# Patient Record
Sex: Male | Born: 1940 | Race: White | Hispanic: No | Smoking: Former smoker
Health system: Southern US, Community
[De-identification: ages and names within clinical notes are randomized; demographics above are authoritative.]

## PROBLEM LIST (undated history)

## (undated) DIAGNOSIS — G629 Polyneuropathy, unspecified: Secondary | ICD-10-CM

## (undated) DIAGNOSIS — T8859XA Other complications of anesthesia, initial encounter: Secondary | ICD-10-CM

## (undated) DIAGNOSIS — I639 Cerebral infarction, unspecified: Secondary | ICD-10-CM

## (undated) HISTORY — PX: CHOLECYSTECTOMY: SHX55

## (undated) HISTORY — PX: PROSTATE SURGERY: SHX751

## (undated) HISTORY — PX: HERNIA REPAIR: SHX51

## (undated) HISTORY — PX: OTHER SURGICAL HISTORY: SHX169

---

## 2012-11-22 ENCOUNTER — Emergency Department: Payer: Self-pay | Admitting: Emergency Medicine

## 2012-11-22 LAB — URINALYSIS, COMPLETE
Glucose,UR: NEGATIVE mg/dL (ref 0–75)
Protein: 100
Specific Gravity: 1.023 (ref 1.003–1.030)
WBC UR: 12 /HPF (ref 0–5)

## 2012-11-24 LAB — URINE CULTURE

## 2020-05-03 ENCOUNTER — Other Ambulatory Visit: Payer: Self-pay | Admitting: Ophthalmology

## 2020-05-03 ENCOUNTER — Other Ambulatory Visit (HOSPITAL_COMMUNITY): Payer: Self-pay | Admitting: Ophthalmology

## 2020-05-03 DIAGNOSIS — G453 Amaurosis fugax: Secondary | ICD-10-CM

## 2020-05-04 ENCOUNTER — Other Ambulatory Visit (HOSPITAL_COMMUNITY): Payer: Self-pay | Admitting: Ophthalmology

## 2020-05-04 DIAGNOSIS — G453 Amaurosis fugax: Secondary | ICD-10-CM

## 2020-05-09 ENCOUNTER — Ambulatory Visit (HOSPITAL_COMMUNITY)
Admission: RE | Admit: 2020-05-09 | Discharge: 2020-05-09 | Disposition: A | Discharge status: Home / Self Care | Payer: Medicare Other | Source: Ambulatory Visit | Attending: Ophthalmology | Admitting: Ophthalmology

## 2020-05-09 ENCOUNTER — Other Ambulatory Visit: Payer: Self-pay

## 2020-05-09 DIAGNOSIS — G453 Amaurosis fugax: Secondary | ICD-10-CM | POA: Insufficient documentation

## 2020-05-09 NOTE — Progress Notes (Signed)
Carotid artery duplex completed. Refer to "CV Proc" under chart review to view preliminary results.  05/09/2020 12:12 PM Eula Fried., MHA, RVT, RDCS, RDMS

## 2020-05-10 ENCOUNTER — Ambulatory Visit (HOSPITAL_COMMUNITY): Payer: Self-pay

## 2020-06-02 ENCOUNTER — Other Ambulatory Visit
Admission: RE | Admit: 2020-06-02 | Discharge: 2020-06-02 | Disposition: A | Discharge status: Home / Self Care | Payer: Medicare Other | Source: Ambulatory Visit | Attending: Ophthalmology | Admitting: Ophthalmology

## 2020-06-02 DIAGNOSIS — G4452 New daily persistent headache (NDPH): Secondary | ICD-10-CM | POA: Insufficient documentation

## 2020-06-02 LAB — CBC WITH DIFFERENTIAL/PLATELET
Abs Immature Granulocytes: 0.04 10*3/uL (ref 0.00–0.07)
Basophils Absolute: 0 10*3/uL (ref 0.0–0.1)
Basophils Relative: 0 %
Eosinophils Absolute: 0.3 10*3/uL (ref 0.0–0.5)
Eosinophils Relative: 4 %
HCT: 41.9 % (ref 39.0–52.0)
Hemoglobin: 14.8 g/dL (ref 13.0–17.0)
Immature Granulocytes: 1 %
Lymphocytes Relative: 30 %
Lymphs Abs: 2.3 10*3/uL (ref 0.7–4.0)
MCH: 29.8 pg (ref 26.0–34.0)
MCHC: 35.3 g/dL (ref 30.0–36.0)
MCV: 84.5 fL (ref 80.0–100.0)
Monocytes Absolute: 0.4 10*3/uL (ref 0.1–1.0)
Monocytes Relative: 6 %
Neutro Abs: 4.6 10*3/uL (ref 1.7–7.7)
Neutrophils Relative %: 59 %
Platelets: 220 10*3/uL (ref 150–400)
RBC: 4.96 MIL/uL (ref 4.22–5.81)
RDW: 12.9 % (ref 11.5–15.5)
WBC: 7.7 10*3/uL (ref 4.0–10.5)
nRBC: 0 % (ref 0.0–0.2)

## 2020-06-02 LAB — C-REACTIVE PROTEIN: CRP: 0.5 mg/dL (ref <1.0)

## 2020-06-02 LAB — SEDIMENTATION RATE: Sed Rate: 4 mm/hr (ref 0–20)

## 2020-06-06 ENCOUNTER — Emergency Department
Admission: EM | Admit: 2020-06-06 | Discharge: 2020-06-06 | Disposition: A | Discharge status: Home / Self Care | Payer: Medicare Other | Attending: Emergency Medicine | Admitting: Emergency Medicine

## 2020-06-06 ENCOUNTER — Other Ambulatory Visit: Payer: Self-pay

## 2020-06-06 ENCOUNTER — Emergency Department: Payer: Medicare Other

## 2020-06-06 DIAGNOSIS — E1142 Type 2 diabetes mellitus with diabetic polyneuropathy: Secondary | ICD-10-CM

## 2020-06-06 DIAGNOSIS — I10 Essential (primary) hypertension: Secondary | ICD-10-CM | POA: Diagnosis not present

## 2020-06-06 DIAGNOSIS — R42 Dizziness and giddiness: Secondary | ICD-10-CM | POA: Insufficient documentation

## 2020-06-06 DIAGNOSIS — Z20822 Contact with and (suspected) exposure to covid-19: Secondary | ICD-10-CM | POA: Diagnosis not present

## 2020-06-06 DIAGNOSIS — E119 Type 2 diabetes mellitus without complications: Secondary | ICD-10-CM | POA: Insufficient documentation

## 2020-06-06 HISTORY — DX: Cerebral infarction, unspecified: I63.9

## 2020-06-06 LAB — URINALYSIS, COMPLETE (UACMP) WITH MICROSCOPIC
Bacteria, UA: NONE SEEN
Bilirubin Urine: NEGATIVE
Glucose, UA: >=500 mg/dL — AB
Ketones, ur: NEGATIVE mg/dL
Leukocytes,Ua: NEGATIVE
Nitrite: NEGATIVE
Protein, ur: 30 mg/dL — AB
Specific Gravity, Urine: 1.024 (ref 1.005–1.030)
Squamous Epithelial / HPF: NONE SEEN (ref 0–5)
pH: 6 (ref 5.0–8.0)

## 2020-06-06 LAB — RESP PANEL BY RT-PCR (FLU A&B, COVID) ARPGX2
Influenza A by PCR: NEGATIVE
Influenza B by PCR: NEGATIVE
SARS Coronavirus 2 by RT PCR: NEGATIVE

## 2020-06-06 LAB — COMPREHENSIVE METABOLIC PANEL
ALT: 20 U/L (ref 0–44)
AST: 23 U/L (ref 15–41)
Albumin: 4.2 g/dL (ref 3.5–5.0)
Alkaline Phosphatase: 55 U/L (ref 38–126)
Anion gap: 9 (ref 5–15)
BUN: 27 mg/dL — ABNORMAL HIGH (ref 8–23)
CO2: 23 mmol/L (ref 22–32)
Calcium: 9.5 mg/dL (ref 8.9–10.3)
Chloride: 104 mmol/L (ref 98–111)
Creatinine, Ser: 1.05 mg/dL (ref 0.61–1.24)
GFR, Estimated: >60 mL/min (ref >60)
Glucose, Bld: 273 mg/dL — ABNORMAL HIGH (ref 70–99)
Potassium: 4 mmol/L (ref 3.5–5.1)
Sodium: 136 mmol/L (ref 135–145)
Total Bilirubin: 0.7 mg/dL (ref 0.3–1.2)
Total Protein: 7.5 g/dL (ref 6.5–8.1)

## 2020-06-06 LAB — DIFFERENTIAL
Abs Immature Granulocytes: 0.02 10*3/uL (ref 0.00–0.07)
Basophils Absolute: 0 10*3/uL (ref 0.0–0.1)
Basophils Relative: 0 %
Eosinophils Absolute: 0.1 10*3/uL (ref 0.0–0.5)
Eosinophils Relative: 2 %
Immature Granulocytes: 0 %
Lymphocytes Relative: 23 %
Lymphs Abs: 1.8 10*3/uL (ref 0.7–4.0)
Monocytes Absolute: 0.5 10*3/uL (ref 0.1–1.0)
Monocytes Relative: 6 %
Neutro Abs: 5.6 10*3/uL (ref 1.7–7.7)
Neutrophils Relative %: 69 %

## 2020-06-06 LAB — CBC
HCT: 42.1 % (ref 39.0–52.0)
Hemoglobin: 14.4 g/dL (ref 13.0–17.0)
MCH: 29.1 pg (ref 26.0–34.0)
MCHC: 34.2 g/dL (ref 30.0–36.0)
MCV: 85.1 fL (ref 80.0–100.0)
Platelets: 226 10*3/uL (ref 150–400)
RBC: 4.95 MIL/uL (ref 4.22–5.81)
RDW: 12.8 % (ref 11.5–15.5)
WBC: 8 10*3/uL (ref 4.0–10.5)
nRBC: 0 % (ref 0.0–0.2)

## 2020-06-06 LAB — PROTIME-INR
INR: 1 (ref 0.8–1.2)
Prothrombin Time: 12.9 seconds (ref 11.4–15.2)

## 2020-06-06 LAB — APTT: aPTT: 26 seconds (ref 24–36)

## 2020-06-06 MED ORDER — MECLIZINE HCL 25 MG PO TABS
25.0000 mg | ORAL_TABLET | Freq: Once | ORAL | Status: AC
  Administered 2020-06-06: 25 mg via ORAL
  Filled 2020-06-06: qty 1

## 2020-06-06 MED ORDER — SODIUM CHLORIDE 0.9% FLUSH: 3.0000 mL | Freq: Once | INTRAVENOUS | Status: DC

## 2020-06-06 NOTE — ED Notes (Signed)
Pt declined dc vitals.

## 2020-06-06 NOTE — ED Provider Notes (Signed)
[facility] Emergency Department Provider Note  ____________________________________________  Time seen: Approximately 2:34 PM  I have reviewed the triage vital signs and the nursing notes.   HISTORY  Chief Complaint Weakness    Level 5 Caveat: Portions of the History and Physical including HPI and review of systems are unable to be completely obtained due to patient being a poor historian   HPI Garrett Henderson is a 80 y.o. male with a past history of stroke, hypertension, hyperlipidemia, diabetes who comes to the ED complaining of dizziness and feeling off balance since about 3:00 AM today.  Has some intermittent mild headache as well, no vision changes or focal weakness or paresthesia.  He has chronic peripheral neuropathy which is unchanged.  No change in speech.  Recently had a stroke characterized by what patient describes temporary monocular vision loss, occurred about a month ago, maintained on aspirin currently.  He reports being seen by ophthalmology who told him that his eye was fine.  Vision feels normal now.  No falls or trauma, no fevers chills chest pain shortness of breath or palpitations.  No neck stiffness or fever.      Past Medical History:  Diagnosis Date  . Stroke [facility])      There are no problems to display for this patient.    Past Surgical History:  Procedure Laterality Date  . PROSTATE SURGERY       Prior to Admission medications   Not on File     Allergies Patient has no allergy information on record.   No family history on file.  Social History    Review of Systems Level 5 Caveat: Portions of the History and Physical including HPI and review of systems are unable to be completely obtained due to patient being a poor historian   Constitutional:   No known fever.  ENT:   No rhinorrhea. Cardiovascular:   No chest pain or syncope. Respiratory:   No dyspnea or cough. Gastrointestinal:   Negative for  abdominal pain, vomiting and diarrhea.  Musculoskeletal:   Negative for focal pain or swelling ____________________________________________   PHYSICAL EXAM:  VITAL SIGNS: ED Triage Vitals  Enc Vitals Group     BP 06/06/20 1307 129/74     Pulse Rate 06/06/20 1307 88     Resp 06/06/20 1307 19     Temp 06/06/20 1307 98 F (36.7 C)     Temp src --      SpO2 06/06/20 1307 96 %     Weight --      Height --      Head Circumference --      Peak Flow --      Pain Score 06/06/20 1305 0     Pain Loc --      Pain Edu? --      Excl. in GC? --     Vital signs reviewed, nursing assessments reviewed.   Constitutional:   Alert and oriented. Non-toxic appearance. Eyes:   Conjunctivae are normal. EOMI. PERRL.  No nystagmus. ENT      Head:   Normocephalic and atraumatic.      Nose:   No congestion/rhinnorhea.       Mouth/Throat:   MMM, no pharyngeal erythema. No peritonsillar mass.       Neck:   No meningismus. Full ROM. Hematological/Lymphatic/Immunilogical:   No cervical lymphadenopathy. Cardiovascular:   RRR. Symmetric bilateral radial and DP pulses.  No murmurs. Cap refill less than 2 seconds. Respiratory:  Normal respiratory effort without tachypnea/retractions. Breath sounds are clear and equal bilaterally. No wheezes/rales/rhonchi. Gastrointestinal:   Soft and nontender. Non distended. There is no CVA tenderness.  No rebound, rigidity, or guarding. Genitourinary:   deferred Musculoskeletal:   Normal range of motion in all extremities. No joint effusions.  No lower extremity tenderness.  No edema. Neurologic:   Normal speech and language.  Poor short-term memory recall Cranial nerves II through XII intact Motor grossly intact.  No drift Normal finger-to-nose.  No pronator drift. No acute focal neurologic deficits are appreciated.  NIH stroke scale 0 Skin:    Skin is warm, dry and intact. No rash noted.  No petechiae, purpura, or  bullae.  ____________________________________________    LABS (pertinent positives/negatives) (all labs ordered are listed, but only abnormal results are displayed) Labs Reviewed  COMPREHENSIVE METABOLIC PANEL - Abnormal; Notable for the following components:      Result Value   Glucose, Bld 273 (*)    BUN 27 (*)    All other components within normal limits  RESP PANEL BY RT-PCR (FLU A&B, COVID) ARPGX2  PROTIME-INR  APTT  CBC  DIFFERENTIAL  URINALYSIS, COMPLETE (UACMP) WITH MICROSCOPIC  I-STAT CREATININE, ED  CBG MONITORING, ED   ____________________________________________   EKG  Interpreted by me Sinus rhythm rate of 89, normal axis, first-degree AV block.  Poor R wave progression.  Normal ST segments and T waves.  No ischemic changes.  ____________________________________________    RADIOLOGY  CT HEAD WO CONTRAST  Result Date: 06/06/2020 CLINICAL DATA:  Neuro deficit, acute stroke suspected. EXAM: CT HEAD WITHOUT CONTRAST TECHNIQUE: Contiguous axial images were obtained from the base of the skull through the vertex without intravenous contrast. COMPARISON:  None. FINDINGS: Brain: No evidence of acute large vascular territory infarction, hemorrhage, hydrocephalus, extra-axial collection or mass lesion/mass effect. Mild to moderate atrophy with ex vacuo ventricular dilation. Vascular: No hyperdense vessel. Calcific intracranial atherosclerosis. Skull: No acute fracture. Sinuses/Orbits: Visualized sinuses are clear. Other: No mastoid effusions. IMPRESSION: No evidence of acute intracranial abnormality. Electronically Signed   By: Feliberto Harts MD   On: 06/06/2020 13:36    ____________________________________________   PROCEDURES Procedures  ____________________________________________  DIFFERENTIAL DIAGNOSIS   Dehydration, vertigo, autonomic insufficiency with orthostatic symptoms, stroke  CLINICAL IMPRESSION / ASSESSMENT AND PLAN / ED COURSE  Medications  ordered in the ED: Medications  sodium chloride flush (NS) 0.9 % injection 3 mL (3 mLs Intravenous Not Given 06/06/20 1357)  meclizine (ANTIVERT) tablet 25 mg (25 mg Oral Given 06/06/20 1402)    Pertinent labs & imaging results that were available during my care of the patient were reviewed by me and considered in my medical decision making (see chart for details).   GLENROY CROSSEN was evaluated in Emergency Department on 06/06/2020 for the symptoms described in the history of present illness. He was evaluated in the context of the global COVID-19 pandemic, which necessitated consideration that the patient might be at risk for infection with the SARS-CoV-2 virus that causes COVID-19. Institutional protocols and algorithms that pertain to the evaluation of patients at risk for COVID-19 are in a state of rapid change based on information released by regulatory bodies including the CDC and federal and state organizations. These policies and algorithms were followed during the patient's care in the ED.   Patient presents with vague dizziness symptoms.  On exam he is neurologically intact.  Vital signs unremarkable.  Labs normal.  With his age and comorbidities, will obtain MRI/MRA head  today to evaluate for possible cerebellar/thalamic stroke.  If negative, he can be discharged home to continue his current outpatient management and follow-up with primary care this week.  Clinical Course as of 06/06/20 1532  Tue Jun 06, 2020  1532 Ambulates with cane, steady gait.  [PS]    Clinical Course User Index [PS] Sharman Cheek, MD     ____________________________________________   FINAL CLINICAL IMPRESSION(S) / ED DIAGNOSES    Final diagnoses:  Dizziness     ED Discharge Orders    None      Portions of this note were generated with dragon dictation software. Dictation errors may occur despite best attempts at proofreading.   Sharman Cheek, MD 06/06/20 1438

## 2020-06-06 NOTE — ED Notes (Signed)
Pt ambulatory with cane. 

## 2020-06-06 NOTE — ED Triage Notes (Addendum)
Pt comes wit hc/o headache, weakness and some dizziness. Pt recently had stroke 4 weeks ago.   Pt states he woke up early this morning around 3am and felt like his head was going to fall off. Pt states he felt off balance.

## 2020-06-06 NOTE — ED Notes (Signed)
Pt assisted with urinal

## 2020-06-06 NOTE — ED Triage Notes (Signed)
First Nurse Note:  C/O dizziness, loss of balance since waking up at 0300.  States when he went to bed last night he felt fine.  AAOx3.  Skin warm and dry. MAE equally and strong.  NAD.  Speech clear.  Facial movements equal.  Patient reports recent CVA.

## 2020-07-28 ENCOUNTER — Emergency Department: Payer: Medicare Other

## 2020-07-28 ENCOUNTER — Encounter: Payer: Self-pay | Admitting: Emergency Medicine

## 2020-07-28 ENCOUNTER — Emergency Department
Admission: EM | Admit: 2020-07-28 | Discharge: 2020-07-28 | Disposition: A | Discharge status: Home / Self Care | Payer: Medicare Other | Attending: Emergency Medicine | Admitting: Emergency Medicine

## 2020-07-28 ENCOUNTER — Other Ambulatory Visit: Payer: Self-pay

## 2020-07-28 DIAGNOSIS — S4992XA Unspecified injury of left shoulder and upper arm, initial encounter: Secondary | ICD-10-CM | POA: Diagnosis present

## 2020-07-28 DIAGNOSIS — Z7984 Long term (current) use of oral hypoglycemic drugs: Secondary | ICD-10-CM | POA: Diagnosis not present

## 2020-07-28 DIAGNOSIS — X58XXXA Exposure to other specified factors, initial encounter: Secondary | ICD-10-CM | POA: Diagnosis not present

## 2020-07-28 DIAGNOSIS — Z87891 Personal history of nicotine dependence: Secondary | ICD-10-CM | POA: Diagnosis not present

## 2020-07-28 DIAGNOSIS — S46912A Strain of unspecified muscle, fascia and tendon at shoulder and upper arm level, left arm, initial encounter: Secondary | ICD-10-CM | POA: Insufficient documentation

## 2020-07-28 MED ORDER — IBUPROFEN 600 MG PO TABS: 600.0000 mg | ORAL_TABLET | Freq: Three times a day (TID) | ORAL | 0 refills | Status: AC | PRN

## 2020-07-28 MED ORDER — IBUPROFEN 800 MG PO TABS
800.0000 mg | ORAL_TABLET | Freq: Once | ORAL | Status: AC
  Administered 2020-07-28: 800 mg via ORAL
  Filled 2020-07-28: qty 1

## 2020-07-28 NOTE — ED Triage Notes (Signed)
Says he pulled a muscle on left arm.  Hurts since Tuesday.  Thinks he may have reached when he was in a drivethrough.  Pain increases if he tries to lift his arm.

## 2020-07-28 NOTE — Discharge Instructions (Addendum)
You were seen today for left shoulder pain.  Your x-ray does not show any acute findings.  We recommend rest, ice for 10 minutes twice daily and stretching exercises.  I have given you prescription for ibuprofen to take every 8 hours as needed with food.  Please follow-up with your PCP if symptoms persist or worsen.

## 2020-07-28 NOTE — ED Provider Notes (Signed)
[facility] Emergency Department Provider Note ____________________________________________  Time seen: 0830  I have reviewed the triage vital signs and the nursing notes.  HISTORY  Chief Complaint  Shoulder Pain   HPI Garrett Henderson is a 80 y.o. male presents to the ER today with complaint of left shoulder pain.  He reports this started 2 days ago.  He describes the pain as sore and achy.  The pain does not radiate.  The pain is worse when he tries to lift his left arm.  He denies numbness, tingling or weakness of his left upper extremity.  He denies any injury to the area but reports pain started after reaching for his wallet while he was in the drive-through.  He has taken some leftover pain pills that he does not remember the name of with some relief of symptoms.  Past Medical History:  Diagnosis Date   Stroke [facility])     There are no problems to display for this patient.   Past Surgical History:  Procedure Laterality Date   PROSTATE SURGERY      Prior to Admission medications   Medication Sig Start Date End Date Taking? Authorizing Provider  gabapentin (NEURONTIN) 100 MG capsule Take 100 mg by mouth 3 (three) times daily.   Yes [provider]  glipiZIDE (GLUCOTROL) 5 MG tablet Take by mouth daily before breakfast.   Yes [provider]  ibuprofen (ADVIL) 600 MG tablet Take 1 tablet (600 mg total) by mouth every 8 (eight) hours as needed. 07/28/20  Yes Lorre Munroe, NP  metFORMIN (GLUCOPHAGE) 1000 MG tablet Take 1,000 mg by mouth 2 (two) times daily with a meal.   Yes [provider]  simvastatin (ZOCOR) 10 MG tablet Take 10 mg by mouth daily.   Yes [provider]    Allergies Patient has no known allergies.  No family history on file.  Social History Social History   Tobacco Use   Smoking status: Former    Types: Cigarettes    Review of Systems  Constitutional: Negative for fever, chills or body  aches. Cardiovascular: Negative for chest pain or chest tightness. Respiratory: Negative for cough shortness of breath. Musculoskeletal: Positive for left shoulder pain.  Negative for neck, back, elbow or wrist pain.  Negative for joint swelling. Skin: Negative for rash. Neurological: Negative for focal weakness, tingling or numbness. ____________________________________________  PHYSICAL EXAM:  VITAL SIGNS: ED Triage Vitals  Enc Vitals Group     BP 07/28/20 0821 139/72     Pulse Rate 07/28/20 0821 94     Resp 07/28/20 0821 18     Temp 07/28/20 0821 98.2 F (36.8 C)     Temp Source 07/28/20 0821 Oral     SpO2 07/28/20 0821 97 %     Weight 07/28/20 0822 180 lb (81.6 kg)     Height 07/28/20 0822 5\' 8"  (1.727 m)     Head Circumference --      Peak Flow --      Pain Score 07/28/20 0816 10     Pain Loc --      Pain Edu? --      Excl. in GC? --     Constitutional: Alert and oriented.  Appears in pain but in no distress. Head: Normocephalic. Cardiovascular: Normal rate, regular rhythm.  Radial pulses 2+ bilaterally. Respiratory: Normal respiratory effort. No wheezes/rales/rhonchi. Musculoskeletal: Decreased external rotation of the left shoulder secondary to pain.  Normal internal rotation of the left  shoulder.  No pain with palpation of the left shoulder or upper arm.  Negative drop can test on the left.  Strength 5/5 BUE.  Handgrips equal. Neurologic: Resting tremor noted of the right upper extremity.  Normal speech and language. Skin:  Skin is warm, dry and intact. No rash noted. ____________________________________________   RADIOLOGY  Imaging Orders  DG Shoulder Left  IMPRESSION:  1. No acute finding.  2. Glenohumeral and inferior acromion spurring.  ____________________________________________   INITIAL IMPRESSION / ASSESSMENT AND PLAN / ED COURSE  Acute Left Shoulder Pain:  DDx include osteoarthritis, left shoulder strain, biceps strain Ibuprofen 800 mg PO  x  1 Xray left shoulder does not show any acute findings RX for Ibuprofen 600 mg Q8H prn- with food Shoulder exercises given Ice may be helpful Follow up with your PCP if symptoms persist      I reviewed the patient's prescription history over the last 12 months in the multi-state controlled substances database(s) that includes [place], [place], [place], [place], [place], [place], [place], [place], [place], [place], [place], [place], [place], and [place].  Results were notable for no recent controlled substances ____________________________________________  FINAL CLINICAL IMPRESSION(S) / ED DIAGNOSES  Final diagnoses:  Strain of left shoulder, initial encounter      Lorre Munroe, NP 07/28/20 0930    Sharman Cheek, MD 07/28/20 1540

## 2021-04-10 ENCOUNTER — Other Ambulatory Visit: Payer: Self-pay

## 2021-04-10 ENCOUNTER — Emergency Department
Admission: EM | Admit: 2021-04-10 | Discharge: 2021-04-10 | Disposition: A | Discharge status: Home / Self Care | Payer: Medicare HMO | Attending: Emergency Medicine | Admitting: Emergency Medicine

## 2021-04-10 ENCOUNTER — Encounter: Payer: Self-pay | Admitting: Emergency Medicine

## 2021-04-10 ENCOUNTER — Emergency Department: Payer: Medicare HMO

## 2021-04-10 DIAGNOSIS — R7309 Other abnormal glucose: Secondary | ICD-10-CM | POA: Insufficient documentation

## 2021-04-10 DIAGNOSIS — D649 Anemia, unspecified: Secondary | ICD-10-CM | POA: Insufficient documentation

## 2021-04-10 DIAGNOSIS — R319 Hematuria, unspecified: Secondary | ICD-10-CM | POA: Insufficient documentation

## 2021-04-10 LAB — URINALYSIS, ROUTINE W REFLEX MICROSCOPIC
Bacteria, UA: NONE SEEN
Bilirubin Urine: NEGATIVE
Glucose, UA: 150 mg/dL — AB
Ketones, ur: NEGATIVE mg/dL
Leukocytes,Ua: NEGATIVE
Nitrite: NEGATIVE
Protein, ur: 30 mg/dL — AB
RBC / HPF: >50 RBC/hpf — ABNORMAL HIGH (ref 0–5)
Specific Gravity, Urine: 1.019 (ref 1.005–1.030)
pH: 5 (ref 5.0–8.0)

## 2021-04-10 LAB — CBC WITH DIFFERENTIAL/PLATELET
Abs Immature Granulocytes: 0.02 10*3/uL (ref 0.00–0.07)
Basophils Absolute: 0 10*3/uL (ref 0.0–0.1)
Basophils Relative: 0 %
Eosinophils Absolute: 0.1 10*3/uL (ref 0.0–0.5)
Eosinophils Relative: 2 %
HCT: 45.5 % (ref 39.0–52.0)
Hemoglobin: 15.3 g/dL (ref 13.0–17.0)
Immature Granulocytes: 0 %
Lymphocytes Relative: 23 %
Lymphs Abs: 1.7 10*3/uL (ref 0.7–4.0)
MCH: 28.6 pg (ref 26.0–34.0)
MCHC: 33.6 g/dL (ref 30.0–36.0)
MCV: 85 fL (ref 80.0–100.0)
Monocytes Absolute: 0.4 10*3/uL (ref 0.1–1.0)
Monocytes Relative: 6 %
Neutro Abs: 5.1 10*3/uL (ref 1.7–7.7)
Neutrophils Relative %: 69 %
Platelets: 210 10*3/uL (ref 150–400)
RBC: 5.35 MIL/uL (ref 4.22–5.81)
RDW: 12.2 % (ref 11.5–15.5)
WBC: 7.3 10*3/uL (ref 4.0–10.5)
nRBC: 0 % (ref 0.0–0.2)

## 2021-04-10 LAB — CBG MONITORING, ED: Glucose-Capillary: 169 mg/dL — ABNORMAL HIGH (ref 70–99)

## 2021-04-10 MED ORDER — SULFAMETHOXAZOLE-TRIMETHOPRIM 800-160 MG PO TABS: 1.0000 | ORAL_TABLET | Freq: Two times a day (BID) | ORAL | 0 refills | Status: AC

## 2021-04-10 NOTE — ED Notes (Signed)
Patient transported to CT 

## 2021-04-10 NOTE — ED Provider Notes (Signed)
? ?[facility] ?Provider Note ? ? ? Event Date/Time  ? First MD Initiated Contact with Patient 04/10/21 1317   ?  (approximate) ? ? ?History  ? ?Hematuria ? ? ?HPI ? ?DIEM PAGNOTTA is a 81 y.o. male who reports he started having hematuria in the middle of the night last night.  He is also passing some clots.  He reports he had kidney stones and [place] an artery and he almost bled out.  He was seen at [facility] and has been here since then.  Unfortunately I cannot get care everywhere to access any records on him at [facility], the computer system there says there are not any.  Additionally I cannot find anything about any visits here for kidney stones although he has many other visits here. ? ?  ? ? ?Physical Exam  ? ?Triage Vital Signs: ?ED Triage Vitals  ?Enc Vitals Group  ?   BP 04/10/21 1023 (!) 157/77  ?   Pulse Rate 04/10/21 1023 98  ?   Resp 04/10/21 1023 16  ?   Temp 04/10/21 1023 98.3 ?F (36.8 ?C)  ?   Temp Source 04/10/21 1023 Oral  ?   SpO2 04/10/21 1023 97 %  ?   Weight 04/10/21 1021 178 lb (80.7 kg)  ?   Height 04/10/21 1021 5\' 8"  (1.727 m)  ?   Head Circumference --   ?   Peak Flow --   ?   Pain Score 04/10/21 1021 0  ?   Pain Loc --   ?   Pain Edu? --   ?   Excl. in GC? --   ? ? ?Most recent vital signs: ?Vitals:  ? 04/10/21 1330 04/10/21 1500  ?BP: (!) 153/74   ?Pulse: 68 73  ?Resp: 18   ?Temp:    ?SpO2: 100% 100%  ? ? ? ?General: Awake, no distress.  ?CV:  Good peripheral perfusion.  Heart regular rate and rhythm no audible murmur ?Resp:  Normal effort.  Lungs are clear ?Abd:  No distention.  Soft and nontender ?Extremities: No edema ? ? ?ED Results / Procedures / Treatments  ? ?Labs ?(all labs ordered are listed, but only abnormal results are displayed) ?Labs Reviewed  ?URINALYSIS, ROUTINE W REFLEX MICROSCOPIC - Abnormal; Notable for the following components:  ?    Result Value  ? Color, Urine YELLOW (*)   ?  APPearance HAZY (*)   ? Glucose, UA 150 (*)   ? Hgb urine dipstick LARGE (*)   ? Protein, ur 30 (*)   ? RBC / HPF >50 (*)   ? All other components within normal limits  ?CBG MONITORING, ED - Abnormal; Notable for the following components:  ? Glucose-Capillary 169 (*)   ? All other components within normal limits  ?CBC WITH DIFFERENTIAL/PLATELET  ? ? ? ?EKG ? ? ? ? ?RADIOLOGY ?CT by my reading shows a very small anterior renal stone and some left-sided hydronephrosis.  Radiology only comments on the very small nonobstructing renal stone. ? ? ?PROCEDURES: ? ?Critical Care performed:  ? ?Procedures ? ? ?MEDICATIONS ORDERED IN ED: ?Medications - No data to display ? ? ?IMPRESSION / MDM / ASSESSMENT AND PLAN / ED COURSE  ?I reviewed the triage vital signs and the nursing notes. ? ?Discussed the patient in detail with Dr. 06/10/21 including my interpretation of the CT as a noted above Dr. Lonna Cobb  will see the patient in the office, patient will come back here if he has any problems urinating for instance if he clots obstructives outflow.  Right now he is not having any problems like that.  He has a high H&H which should keep him from becoming anemic.  I will have him return here if he has any increasing bleeding or cannot urinate or get lightheaded or has any other problems.  Dr. Lonna Cobb wants him to take Bactrim in the meantime ?Stone that the patient does have would not cause any problems.  Not sure why he is having hematuria.  He may have something going on with his prostate again.  He likely will need cystoscope. ? ?Clinical Course as of 04/10/21 1628  ?Tue Apr 10, 2021  ?1353 CBG monitoring, ED(!) [PM]  ?  ?Clinical Course User Index ?[PM] Arnaldo Natal, MD  ? ? ? ?FINAL CLINICAL IMPRESSION(S) / ED DIAGNOSES  ? ?Final diagnoses:  ?Hematuria, unspecified type  ? ? ? ?Rx / DC Orders  ? ?ED Discharge Orders   ? ?      Ordered  ?  sulfamethoxazole-trimethoprim (BACTRIM DS) 800-160 MG tablet  2 times daily       ?  04/10/21 1501  ? ?  ?  ? ?  ? ? ? ?Note:  This document was prepared using Dragon voice recognition software and may include unintentional dictation errors. ?  ?Arnaldo Natal, MD ?04/10/21 1630 ? ?

## 2021-04-10 NOTE — ED Triage Notes (Signed)
Pt to ED via POV c/o blood in his urine that started in the middle of the night. Pt reports that he is passing some clots as well. Pt reports increased urination yesterday. 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 Pt denies any more back pain than his normal pain. Pt is in NAD.  ?

## 2021-04-10 NOTE — Discharge Instructions (Addendum)
Please follow-up with [facility].  I have spoken with Dr. Lonna Cobb but he is in the operating room and cannot make an appointment for you right now.  I would call the office at the number I have supplied let them know you were in the emergency room with blood in the urine and the Dr. Lonna Cobb wanted to follow you up in the office.  Please do not hesitate to return if you get a fever or lightheaded or become obstructed and cannot urinate or have worse pain.  Dr. Lonna Cobb wants you to take Septra a sulfa antibiotic 1 pill twice a day to make sure you are not getting an infection. ?

## 2021-04-12 ENCOUNTER — Encounter: Payer: Self-pay | Admitting: Urology

## 2021-04-12 ENCOUNTER — Ambulatory Visit: Payer: Medicare HMO | Admitting: Urology

## 2021-04-12 VITALS — BP 136/77 | HR 73 | Ht 68.0 in | Wt 177.0 lb

## 2021-04-12 DIAGNOSIS — R310 Gross hematuria: Secondary | ICD-10-CM | POA: Diagnosis not present

## 2021-04-12 LAB — URINALYSIS, COMPLETE
Bilirubin, UA: NEGATIVE
Ketones, UA: NEGATIVE
Leukocytes,UA: NEGATIVE
Nitrite, UA: NEGATIVE
Protein,UA: NEGATIVE
Specific Gravity, UA: >1.03 — ABNORMAL HIGH (ref 1.005–1.030)
Urobilinogen, Ur: 0.2 mg/dL (ref 0.2–1.0)
pH, UA: 6 (ref 5.0–7.5)

## 2021-04-12 LAB — MICROSCOPIC EXAMINATION
Bacteria, UA: NONE SEEN
RBC, Urine: NONE SEEN /hpf (ref 0–2)

## 2021-04-12 NOTE — Progress Notes (Signed)
? ?04/12/2021 ?9:38 AM  ? ?[name] ?04-12-1940 ?[id number] ? ?Referring provider: Lenard Simmer, MD ?[place] ?[place],  [place] [address] ? ?Chief Complaint  ?Patient presents with  ? Hematuria  ? ? ?HPI: ?Garrett Henderson is a 81 y.o. male presents for evaluation of gross hematuria.  He is accompanied by his daughter. ? ?Onset gross hematuria with clots 04/09/2021 and lasted <24 hours ?Denies flank, abdominal or pelvic pain ?Mild frequency and urgency ?Presented to the ED 04/10/2021 ?UA grossly clear with >50 RBC/6-10 WBC ?Started on Septra DS; urine culture not ordered ?Noncontrast CT was performed which showed punctate left lower pole calculus.  No hydronephrosis or mass noted ?Prior history stone disease and apparently underwent TURP and stone surgery in [place] around 2013.  He states and "artery was nicked" and he was life flighted to [facility] in [place] ? ? ?PMH: ?Past Medical History:  ?Diagnosis Date  ? Stroke [facility])   ? ? ?Surgical History: ?Past Surgical History:  ?Procedure Laterality Date  ? PROSTATE SURGERY    ? ? ?Home Medications:  ?Allergies as of 04/12/2021   ?No Known Allergies ?  ? ?  ?Medication List  ?  ? ?  ? Accurate as of April 12, 2021  9:38 AM. If you have any questions, ask your nurse or doctor.  ?  ?  ? ?  ? ?gabapentin 100 MG capsule ?Commonly known as: NEURONTIN ?Take 100 mg by mouth 3 (three) times daily. ?  ?glipiZIDE 5 MG tablet ?Commonly known as: GLUCOTROL ?Take by mouth daily before breakfast. ?  ?ibuprofen 600 MG tablet ?Commonly known as: ADVIL ?Take 1 tablet (600 mg total) by mouth every 8 (eight) hours as needed. ?  ?metFORMIN 1000 MG tablet ?Commonly known as: GLUCOPHAGE ?Take 1,000 mg by mouth 2 (two) times daily with a meal. ?  ?pioglitazone 15 MG tablet ?Commonly known as: ACTOS ?Take 15 mg by mouth daily. ?  ?simvastatin 10 MG tablet ?Commonly known as: ZOCOR ?Take 10 mg by mouth daily. ?  ?sulfamethoxazole-trimethoprim 800-160 MG tablet ?Commonly  known as: BACTRIM DS ?Take 1 tablet by mouth 2 (two) times daily. ?  ? ?  ? ? ?Allergies: No Known Allergies ? ?Family History: ?History reviewed. No pertinent family history. ? ?Social History:  reports that he has quit smoking. His smoking use included cigarettes. He does not have any smokeless tobacco history on file. No history on file for alcohol use and drug use. ? ? ?Physical Exam: ?BP 136/77   Pulse 73   Ht 5\' 8"  (1.727 m)   Wt 177 lb (80.3 kg)   BMI 26.91 kg/m?   ?Constitutional:  Alert and oriented, No acute distress. ?HEENT: [place] AT, moist mucus membranes.  Trachea midline, no masses. ?Cardiovascular: No clubbing, cyanosis, or edema. ?Respiratory: Normal respiratory effort, no increased work of breathing. ?Skin: No rashes, bruises or suspicious lesions. ?Neurologic: Grossly intact, no focal deficits, moving all 4 extremities. ?Psychiatric: Normal mood and affect. ? ?Laboratory Data: ? ?Urinalysis ?Dipstick 2+ glucose/trace blood ?Microscopy negative ? ? ?Pertinent Imaging: ?CT images were personally reviewed and interpreted ? ?CT Renal Stone Study ? ?Narrative ?CLINICAL DATA:  Hematuria.   prostate surgery. ? ?EXAM: ?CT ABDOMEN AND PELVIS WITHOUT CONTRAST ? ?TECHNIQUE: ?Multidetector CT imaging of the abdomen and pelvis was performed ?following the standard protocol without IV contrast. ? ?RADIATION DOSE REDUCTION: This exam was performed according to the ?departmental dose-optimization program which includes automated ?exposure control, adjustment of the mA  and/or kV according to ?patient size and/or use of iterative reconstruction technique. ? ?COMPARISON:  None. ? ?FINDINGS: ?Lower chest: Clear lung bases. Normal heart size without pericardial ?or pleural effusion. Multivessel coronary artery atherosclerosis. ?Tiny hiatal hernia. ? ?Hepatobiliary: Normal liver. Cholecystectomy, without biliary ductal ?dilatation. ? ?Pancreas: Parenchymal calcifications within the head and neck. No ?duct dilatation or  acute inflammation. ? ?Spleen: Normal in size, without focal abnormality. ? ?Adrenals/Urinary Tract: Normal adrenal glands. Subcentimeter upper ?pole right renal lesion is fluid density and likely a cyst . No f/up ?imaging recommended. Right renal vascular calcification. Punctate ?lower pole left renal collecting system stone. No hydronephrosis. No ?hydroureter or ureteric calculi. No bladder calculi. Tiny ?right-sided bladder diverticulum on 66/2. ? ?Stomach/Bowel: Proximal gastric underdistention. Colonic stool ?burden suggests constipation. Normal terminal ileum and appendix. ?Prior enterotomy. ? ?Vascular/Lymphatic: Aortic atherosclerosis. Small nodes in the ?jejunal mesentery with increased density in the mesenteric fat. No ?pelvic sidewall adenopathy. ? ?Reproductive: Moderate prostatomegaly. ? ?Other: No significant free fluid. No free intraperitoneal air. Small ?fat containing right inguinal hernia. ? ?Musculoskeletal: Degenerate changes of both hips. Flowing ?syndesmophytes throughout the thoracolumbar spine can be seen with ?ankylosing spondylitis. ? ?IMPRESSION: ?1. Left nephrolithiasis, without obstructive uropathy. ?2. Prostatomegaly with small bladder diverticulum, suggesting a ?component of outlet obstruction. ?3. No other explanation for patient's symptoms. ?4. Coronary artery atherosclerosis. Aortic Atherosclerosis ?(ICD10-I70.0). ?5.  Possible constipation. ?6. Small bowel mesenteric findings which suggest ?panniculitis/adenitis. These are of indeterminate acuity and ?clinical significance. ?7. 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 ? ? ?Electronically Signed ?By: Abigail Miyamoto M.D. ?On: 04/10/2021 14:55 ? ? ?Assessment & Plan:   ? ?1. Gross hematuria ?Resolved ?Upper tract imaging recommended for gross hematuria is a CT urogram.  Noncontrast CT without abnormalities ?Recommend lower tract evaluation with cystoscopy and if no abnormalities noted may need to  proceed with CT urogram ? ? ? ?Abbie Sons, MD ? ?[facility] ?[address], [address] ?[place], [place] [address] ?[phone] ? ?

## 2021-04-16 ENCOUNTER — Other Ambulatory Visit: Payer: Self-pay

## 2021-04-19 ENCOUNTER — Telehealth: Payer: Self-pay | Admitting: Urology

## 2021-04-19 NOTE — Telephone Encounter (Signed)
Pt called back and would like to proceed with cystoscopy. Pt would like valium sent to [facility]. ?

## 2021-04-19 NOTE — Telephone Encounter (Signed)
Patient is scheduled for a cysto on Monday, 4/17.  He called the office today requesting a prescription for a valium to take prior to his procedure.  He is very nervous about the procedure.  He stated that when we check his urine, if there is no blood in the result, he may not have the cysto. ? ?Please advise. ?

## 2021-04-20 NOTE — Telephone Encounter (Signed)
Pt called again concerned about the valium not being sent over to the pharmacy yet.pt will cancel if he doesn't have valium. I advised pt that it will be sent. ?

## 2021-04-22 MED ORDER — DIAZEPAM 2 MG PO TABS: ORAL_TABLET | ORAL | 0 refills | Status: DC

## 2021-04-22 NOTE — Telephone Encounter (Signed)
Rx sent 

## 2021-04-23 ENCOUNTER — Encounter: Payer: Self-pay | Admitting: Urology

## 2021-04-23 ENCOUNTER — Other Ambulatory Visit: Payer: Medicare HMO | Admitting: Urology

## 2021-05-01 ENCOUNTER — Telehealth: Payer: Self-pay

## 2021-05-01 ENCOUNTER — Other Ambulatory Visit: Payer: Self-pay

## 2021-05-01 DIAGNOSIS — R195 Other fecal abnormalities: Secondary | ICD-10-CM

## 2021-05-01 MED ORDER — PEG 3350-KCL-NA BICARB-NACL 420 G PO SOLR: 4000.0000 mL | Freq: Once | ORAL | 0 refills | Status: AC

## 2021-05-01 NOTE — Telephone Encounter (Signed)
Gastroenterology Pre-Procedure Review ? ?Request Date: Thursday 05/10/21 ?Requesting Physician: Dr. Tobi Bastos ? ?PATIENT REVIEW QUESTIONS: The patient's daughter Rosaria Ferries responded to the following health history questions as indicated:   ? ?1. Are you having any GI issues? Positive Colorectal ?2. Do you have a personal history of Polyps? no ?3. Do you have a family history of Colon Cancer or Polyps? no ?4. Diabetes Mellitus? yes (Controlled with oral meds) ?5. Joint replacements in the past 12 months?no ?6. Major health problems in the past 3 months? Hematuria ER visit 04/10/21 but resolved. ?7. Any artificial heart valves, MVP, or defibrillator?no ?   ?MEDICATIONS & ALLERGIES:    ?Patient reports the following regarding taking any anticoagulation/antiplatelet therapy:   ?Plavix, Coumadin, Eliquis, Xarelto, Lovenox, Pradaxa, Brilinta, or Effient? no ?Aspirin? no ? ?Patient confirms/reports the following medications:  ?Current Outpatient Medications  ?Medication Sig Dispense Refill  ? diazepam (VALIUM) 2 MG tablet 1 tab 30 minutes prior to procedure 1 tablet 0  ? gabapentin (NEURONTIN) 100 MG capsule Take 100 mg by mouth 3 (three) times daily.    ? glipiZIDE (GLUCOTROL) 5 MG tablet Take by mouth daily before breakfast.    ? ibuprofen (ADVIL) 600 MG tablet Take 1 tablet (600 mg total) by mouth every 8 (eight) hours as needed. 15 tablet 0  ? metFORMIN (GLUCOPHAGE) 1000 MG tablet Take 1,000 mg by mouth 2 (two) times daily with a meal.    ? pioglitazone (ACTOS) 15 MG tablet Take 15 mg by mouth daily.    ? simvastatin (ZOCOR) 10 MG tablet Take 10 mg by mouth daily.    ? sulfamethoxazole-trimethoprim (BACTRIM DS) 800-160 MG tablet Take 1 tablet by mouth 2 (two) times daily. 20 tablet 0  ? ?No current facility-administered medications for this visit.  ? ? ?Patient confirms/reports the following allergies:  ?No Known Allergies ? ?No orders of the defined types were placed in this encounter. ? ? ?AUTHORIZATION  INFORMATION ?Primary Insurance: ?1D#: ?Group #: ? ?Secondary Insurance: ?1D#: ?Group #: ? ?SCHEDULE INFORMATION: ?Date: 05/10/21 ?Time: ?Location: [facility] ?

## 2021-05-09 ENCOUNTER — Encounter: Payer: Self-pay | Admitting: Gastroenterology

## 2021-05-10 ENCOUNTER — Ambulatory Visit: Payer: Medicare HMO | Admitting: Anesthesiology

## 2021-05-10 ENCOUNTER — Encounter: Payer: Self-pay | Admitting: Gastroenterology

## 2021-05-10 ENCOUNTER — Ambulatory Visit
Admission: RE | Admit: 2021-05-10 | Discharge: 2021-05-10 | Disposition: A | Discharge status: Home / Self Care | Payer: Medicare HMO | Attending: Gastroenterology | Admitting: Gastroenterology

## 2021-05-10 ENCOUNTER — Encounter
Admission: RE | Disposition: A | Discharge status: Home / Self Care | Payer: Self-pay | Source: Home / Self Care | Attending: Gastroenterology

## 2021-05-10 DIAGNOSIS — Z87891 Personal history of nicotine dependence: Secondary | ICD-10-CM | POA: Diagnosis not present

## 2021-05-10 DIAGNOSIS — E1140 Type 2 diabetes mellitus with diabetic neuropathy, unspecified: Secondary | ICD-10-CM | POA: Diagnosis not present

## 2021-05-10 DIAGNOSIS — R195 Other fecal abnormalities: Secondary | ICD-10-CM | POA: Insufficient documentation

## 2021-05-10 DIAGNOSIS — Z1211 Encounter for screening for malignant neoplasm of colon: Secondary | ICD-10-CM | POA: Diagnosis present

## 2021-05-10 DIAGNOSIS — Z8673 Personal history of transient ischemic attack (TIA), and cerebral infarction without residual deficits: Secondary | ICD-10-CM | POA: Diagnosis not present

## 2021-05-10 DIAGNOSIS — K5730 Diverticulosis of large intestine without perforation or abscess without bleeding: Secondary | ICD-10-CM | POA: Diagnosis not present

## 2021-05-10 DIAGNOSIS — Z7984 Long term (current) use of oral hypoglycemic drugs: Secondary | ICD-10-CM | POA: Insufficient documentation

## 2021-05-10 HISTORY — DX: Polyneuropathy, unspecified: G62.9

## 2021-05-10 HISTORY — DX: Other complications of anesthesia, initial encounter: T88.59XA

## 2021-05-10 HISTORY — PX: COLONOSCOPY WITH PROPOFOL: SHX5780

## 2021-05-10 LAB — GLUCOSE, CAPILLARY: Glucose-Capillary: 97 mg/dL (ref 70–99)

## 2021-05-10 SURGERY — COLONOSCOPY WITH PROPOFOL
Anesthesia: General

## 2021-05-10 MED ORDER — PROPOFOL 500 MG/50ML IV EMUL
INTRAVENOUS | Status: DC | PRN
  Administered 2021-05-10: 200 ug/kg/min via INTRAVENOUS

## 2021-05-10 MED ORDER — PROPOFOL 10 MG/ML IV BOLUS
INTRAVENOUS | Status: DC | PRN
  Administered 2021-05-10: 50 mg via INTRAVENOUS

## 2021-05-10 MED ORDER — SODIUM CHLORIDE 0.9 % IV SOLN: INTRAVENOUS | Status: DC

## 2021-05-10 NOTE — Anesthesia Preprocedure Evaluation (Addendum)
Anesthesia Evaluation  ?Patient identified by MRN, date of birth, ID band ?Patient awake ? ? ? ?Reviewed: ?Allergy & Precautions, NPO status , Patient's Chart, lab work & pertinent test results ? ?History of Anesthesia Complications ?Negative for: history of anesthetic complications ? ?Airway ?Mallampati: III ? ? ?Neck ROM: Full ? ? ? Dental ? ?(+) Poor Dentition ?  ?Pulmonary ?former smoker (quit 1986),  ?  ?Pulmonary exam normal ?breath sounds clear to auscultation ? ? ? ? ? ? Cardiovascular ?Exercise Tolerance: Good ?negative cardio ROS ?Normal cardiovascular exam ?Rhythm:Regular Rate:Normal ? ?ECG 06/06/20: SR with 1st degree AVB and PACs; anterior infarct, age undetermined ? ?Echo 08/02/20:  ???NORMAL LEFT VENTRICULAR SYSTOLIC FUNCTION  ???NORMAL LA PRESSURES WITH NORMAL DIASTOLIC FUNCTION  ???NORMAL RIGHT VENTRICULAR SYSTOLIC FUNCTION  ???VALVULAR REGURGITATION: TRIVIAL AR, TRIVIAL MR  ???NO VALVULAR STENOSIS  ???NO PRIOR STUDY FOR COMPARISON  ???NEGATIVE SALINE CONTRAST STUDY ?  ?Neuro/Psych ? Neuromuscular disease (neuropathy; ambulates with cane) CVA (2022), No Residual Symptoms   ? GI/Hepatic ?negative GI ROS,   ?Endo/Other  ?diabetes, Type 2 ? Renal/GU ?negative Renal ROS  ? ?  ?Musculoskeletal ? ? Abdominal ?  ?Peds ? Hematology ?negative hematology ROS ?(+)   ?Anesthesia Other Findings ? ? Reproductive/Obstetrics ? ?  ? ? ? ? ? ? ? ? ? ? ? ? ? ?  ?  ? ? ? ? ? ? ? ?Anesthesia Physical ?Anesthesia Plan ? ?ASA: 2 ? ?Anesthesia Plan: General  ? ?Post-op Pain Management:   ? ?Induction: Intravenous ? ?PONV Risk Score and Plan: 2 and Propofol infusion, TIVA and Treatment may vary due to age or medical condition ? ?Airway Management Planned: Natural Airway ? ?Additional Equipment:  ? ?Intra-op Plan:  ? ?Post-operative Plan:  ? ?Informed Consent: I have reviewed the patients History and Physical, chart, labs and discussed the procedure including the risks, benefits and  alternatives for the proposed anesthesia with the patient or authorized representative who has indicated his/her understanding and acceptance.  ? ? ? ? ? ?Plan Discussed with: CRNA ? ?Anesthesia Plan Comments: (LMA/GETA backup discussed.  Patient consented for risks of anesthesia including but not limited to:  ?- adverse reactions to medications ?- damage to eyes, teeth, lips or other oral mucosa ?- nerve damage due to positioning  ?- sore throat or hoarseness ?- damage to heart, brain, nerves, lungs, other parts of body or loss of life ? ?Informed patient about role of CRNA in peri- and intra-operative care.  Patient voiced understanding.)  ? ? ? ? ? ? ?Anesthesia Quick Evaluation ? ?

## 2021-05-10 NOTE — Transfer of Care (Signed)
Immediate Anesthesia Transfer of Care Note ? ?Patient: Garrett Henderson ? ?Procedure(s) Performed: COLONOSCOPY WITH PROPOFOL ? ?Patient Location: PACU ? ?Anesthesia Type:General ? ?Level of Consciousness: awake ? ?Airway & Oxygen Therapy: Patient Spontanous Breathing and Patient connected to nasal cannula oxygen ? ?Post-op Assessment: Report given to RN and Post -op Vital signs reviewed and stable ? ?Post vital signs: Reviewed and stable ? ?Last Vitals:  ?Vitals Value Taken Time  ?BP 92/38 05/10/21 1110  ?Temp 36.1 ?C 05/10/21 1110  ?Pulse 77 05/10/21 1110  ?Resp 22 05/10/21 1110  ?SpO2 77 % 05/10/21 1110  ? ? ?Last Pain:  ?Vitals:  ? 05/10/21 1110  ?TempSrc: Temporal  ?PainSc: Asleep  ?   ? ?  ? ?Complications: No notable events documented. ?

## 2021-05-10 NOTE — H&P (Signed)
? ? ? ?Wyline Mood, MD ?[address], [place], [place], [address] ?[address], [address], [place], [place], [address] ?Phone: [phone]  ?Fax: [phone] ? ?Primary Care Physician:  Alan Mulder, MD ? ? ?Pre-Procedure History & Physical: ?HPI:  Garrett Henderson is a 81 y.o. male is here for an colonoscopy. ?  ?Past Medical History:  ?Diagnosis Date  ? Complication of anesthesia   ? Neuropathy   ? Stroke [facility])   ? ? ?Past Surgical History:  ?Procedure Laterality Date  ? CHOLECYSTECTOMY    ? HERNIA REPAIR    ? kidney stone    ? PROSTATE SURGERY    ? ? ?Prior to Admission medications   ?Medication Sig Start Date End Date Taking? Authorizing Provider  ?lisinopril (ZESTRIL) 20 MG tablet Take 20 mg by mouth daily.   Yes [provider]  ?diazepam (VALIUM) 2 MG tablet 1 tab 30 minutes prior to procedure ?Patient not taking: Reported on 05/07/2021 04/22/21   Riki Altes, MD  ?gabapentin (NEURONTIN) 100 MG capsule Take 100 mg by mouth 3 (three) times daily.    [provider]  ?glipiZIDE (GLUCOTROL) 5 MG tablet Take by mouth daily before breakfast.    [provider]  ?ibuprofen (ADVIL) 600 MG tablet Take 1 tablet (600 mg total) by mouth every 8 (eight) hours as needed. 07/28/20   Lorre Munroe, NP  ?metFORMIN (GLUCOPHAGE) 1000 MG tablet Take 1,000 mg by mouth 2 (two) times daily with a meal. ?Patient not taking: Reported on 05/07/2021    [provider]  ?pioglitazone (ACTOS) 15 MG tablet Take 15 mg by mouth daily. 04/04/21   [provider]  ?simvastatin (ZOCOR) 20 MG tablet Take 20 mg by mouth daily.    [provider]  ?sulfamethoxazole-trimethoprim (BACTRIM DS) 800-160 MG tablet Take 1 tablet by mouth 2 (two) times daily. 04/10/21   Arnaldo Natal, MD  ? ? ?Allergies as of 05/01/2021  ? (No Known Allergies)  ? ? ?History reviewed. No pertinent family history. ? ?Social History  ? ?Socioeconomic History  ? Marital status: Widowed  ?  Spouse  name: Not on file  ? Number of children: Not on file  ? Years of education: Not on file  ? Highest education level: Not on file  ?Occupational History  ? Not on file  ?Tobacco Use  ? Smoking status: Former  ?  Types: Cigarettes  ? Smokeless tobacco: Not on file  ?Substance and Sexual Activity  ? Alcohol use: Never  ? Drug use: Never  ? Sexual activity: Not on file  ?Other Topics Concern  ? Not on file  ?Social History Narrative  ? Not on file  ? ?Social Determinants of Health  ? ?Financial Resource Strain: Not on file  ?Food Insecurity: Not on file  ?Transportation Needs: Not on file  ?Physical Activity: Not on file  ?Stress: Not on file  ?Social Connections: Not on file  ?Intimate Partner Violence: Not on file  ? ? ?Review of Systems: ?See HPI, otherwise negative ROS ? ?Physical Exam: ?BP (!) 148/75   Pulse 92   Temp (!) 97.2 ?F (36.2 ?C) (Temporal)   Resp 17   Ht 5\' 8"  (1.727 m)   Wt 80.3 kg   SpO2 100%   BMI 26.91 kg/m?  ?General:   Alert,  pleasant and cooperative in NAD ?Head:  Normocephalic and atraumatic. ?Neck:  Supple; no masses or thyromegaly. ?Lungs:  Clear throughout to auscultation, normal  respiratory effort.    ?Heart:  +S1, +S2, Regular rate and rhythm, No edema. ?Abdomen:  Soft, nontender and nondistended. Normal bowel sounds, without guarding, and without rebound.   ?Neurologic:  Alert and  oriented x4;  grossly normal neurologically. ? ?Impression/Plan: ?Garrett Henderson is here for an colonoscopy to be performed for positive cologuard. Risks, benefits, limitations, and alternatives regarding  colonoscopy have been reviewed with the patient.  Questions have been answered.  All parties agreeable. ? ? ?Wyline Mood, MD  05/10/2021, 10:30 AM ? ?

## 2021-05-10 NOTE — Anesthesia Postprocedure Evaluation (Signed)
Anesthesia Post Note ? ?Patient: Garrett Henderson ? ?Procedure(s) Performed: COLONOSCOPY WITH PROPOFOL ? ?Patient location during evaluation: PACU ?Anesthesia Type: General ?Level of consciousness: awake and alert, oriented and patient cooperative ?Pain management: pain level controlled ?Vital Signs Assessment: post-procedure vital signs reviewed and stable ?Respiratory status: spontaneous breathing, nonlabored ventilation and respiratory function stable ?Cardiovascular status: blood pressure returned to baseline and stable ?Postop Assessment: adequate PO intake ?Anesthetic complications: no ? ? ?No notable events documented. ? ? ?Last Vitals:  ?Vitals:  ? 05/10/21 1120 05/10/21 1130  ?BP: (!) 116/54 (!) 135/54  ?Pulse: 78 71  ?Resp: 15 (!) 22  ?Temp:    ?SpO2: 100% 95%  ?  ?Last Pain:  ?Vitals:  ? 05/10/21 1130  ?TempSrc:   ?PainSc: 0-No pain  ? ? ?  ?  ?  ?  ?  ?  ? ?Darrin Nipper ? ? ? ? ?

## 2021-05-10 NOTE — Anesthesia Procedure Notes (Signed)
Date/Time: 05/10/2021 10:46 AM ?Performed by: Junious Silk, CRNA ?Pre-anesthesia Checklist: Patient identified, Emergency Drugs available, Suction available, Patient being monitored and Timeout performed ? ? ? ? ?

## 2021-05-10 NOTE — Op Note (Signed)
[facility] ?Gastroenterology ?Patient Name: Garrett Henderson ?Procedure Date: 05/10/2021 10:47 AM ?MRN: [id number] ?Account #: [id number] ?Date of Birth: 04-19-1940 ?Admit Type: Outpatient ?Age: 81 ?Room: [facility] ENDO ROOM 4 ?Gender: Male ?Note Status: Finalized ?Instrument Name: Colonoscope [id number] ?Procedure:             Colonoscopy ?Indications:           Positive Cologuard test ?Providers:             Wyline Mood MD, MD ?Referring MD:          Alan Mulder, MD (Referring MD) ?Medicines:             Monitored Anesthesia Care ?Complications:         No immediate complications. ?Procedure:             Pre-Anesthesia Assessment: ?                       - Prior to the procedure, a History and Physical was  ?                       performed, and patient medications, allergies and  ?                       sensitivities were reviewed. The patient's tolerance  ?                       of previous anesthesia was reviewed. ?                       - The risks and benefits of the procedure and the  ?                       sedation options and risks were discussed with the  ?                       patient. All questions were answered and informed  ?                       consent was obtained. ?                       - ASA Grade Assessment: II - A patient with mild  ?                       systemic disease. ?                       After obtaining informed consent, the colonoscope was  ?                       passed under direct vision. Throughout the procedure,  ?                       the patient's blood pressure, pulse, and oxygen  ?                       saturations were monitored continuously. The  ?                       Colonoscope was introduced through  the anus and  ?                       advanced to the the cecum, identified by the  ?                       appendiceal orifice. The colonoscopy was performed  ?                       with ease. The patient tolerated the procedure well.  ?                        The quality of the bowel preparation was excellent. ?Findings: ?     The perianal and digital rectal examinations were normal. ?     Multiple small-mouthed diverticula were found in the sigmoid colon. ?     The exam was otherwise without abnormality. ?Impression:            - Diverticulosis in the sigmoid colon. ?                       - The examination was otherwise normal. ?                       - No specimens collected. ?Recommendation:        - Discharge patient to home (with escort). ?                       - Resume previous diet. ?                       - Continue present medications. ?                       - Repeat colonoscopy is not recommended due to current  ?                       age 59(66 years or older) for screening purposes. ?Procedure Code(s):     --- Professional --- ?                       [phone], Colonoscopy, flexible; diagnostic, including  ?                       collection of specimen(s) by brushing or washing, when  ?                       performed (separate procedure) ?Diagnosis Code(s):     --- Professional --- ?                       R19.5, Other fecal abnormalities ?                       K57.30, Diverticulosis of large intestine without  ?                       perforation or abscess without bleeding ?CPT copyright 2019 [employer] Medical Association. All rights reserved. ?The codes documented in this report are preliminary and upon coder review may  ?be revised to meet current compliance requirements. ?Wyline MoodKiran Yaser Harvill, MD ?Sharlet SalinaKiran  Tobi Bastos MD, MD ?05/10/2021 11:10:00 AM ?This report has been signed electronically. ?Number of Addenda: 0 ?Note Initiated On: 05/10/2021 10:47 AM ?Scope Withdrawal Time: 0 hours 10 minutes 7 seconds  ?Total Procedure Duration: 0 hours 13 minutes 35 seconds  ?Estimated Blood Loss:  Estimated blood loss: none. ?     [facility] ?

## 2021-05-11 ENCOUNTER — Encounter: Payer: Self-pay | Admitting: Gastroenterology

## 2021-09-01 ENCOUNTER — Emergency Department: Payer: Medicare HMO

## 2021-09-01 ENCOUNTER — Other Ambulatory Visit: Payer: Self-pay

## 2021-09-01 ENCOUNTER — Emergency Department
Admission: EM | Admit: 2021-09-01 | Discharge: 2021-09-01 | Discharge status: Against Medical Advice | Payer: Medicare HMO | Attending: Emergency Medicine | Admitting: Emergency Medicine

## 2021-09-01 DIAGNOSIS — Y92019 Unspecified place in single-family (private) house as the place of occurrence of the external cause: Secondary | ICD-10-CM | POA: Diagnosis not present

## 2021-09-01 DIAGNOSIS — Y990 Civilian activity done for income or pay: Secondary | ICD-10-CM | POA: Insufficient documentation

## 2021-09-01 DIAGNOSIS — R059 Cough, unspecified: Secondary | ICD-10-CM | POA: Diagnosis not present

## 2021-09-01 DIAGNOSIS — M25512 Pain in left shoulder: Secondary | ICD-10-CM | POA: Diagnosis present

## 2021-09-01 DIAGNOSIS — W19XXXA Unspecified fall, initial encounter: Secondary | ICD-10-CM | POA: Insufficient documentation

## 2021-09-01 DIAGNOSIS — M79602 Pain in left arm: Secondary | ICD-10-CM | POA: Diagnosis not present

## 2021-09-01 LAB — TROPONIN I (HIGH SENSITIVITY): Troponin I (High Sensitivity): 6 ng/L (ref <18)

## 2021-09-01 LAB — BASIC METABOLIC PANEL
Anion gap: 8 (ref 5–15)
BUN: 29 mg/dL — ABNORMAL HIGH (ref 8–23)
CO2: 23 mmol/L (ref 22–32)
Calcium: 9 mg/dL (ref 8.9–10.3)
Chloride: 107 mmol/L (ref 98–111)
Creatinine, Ser: 1.18 mg/dL (ref 0.61–1.24)
GFR, Estimated: >60 mL/min (ref >60)
Glucose, Bld: 190 mg/dL — ABNORMAL HIGH (ref 70–99)
Potassium: 3.9 mmol/L (ref 3.5–5.1)
Sodium: 138 mmol/L (ref 135–145)

## 2021-09-01 LAB — CBC
HCT: 43.4 % (ref 39.0–52.0)
Hemoglobin: 14.3 g/dL (ref 13.0–17.0)
MCH: 28.4 pg (ref 26.0–34.0)
MCHC: 32.9 g/dL (ref 30.0–36.0)
MCV: 86.1 fL (ref 80.0–100.0)
Platelets: 218 10*3/uL (ref 150–400)
RBC: 5.04 MIL/uL (ref 4.22–5.81)
RDW: 12.9 % (ref 11.5–15.5)
WBC: 6 10*3/uL (ref 4.0–10.5)
nRBC: 0 % (ref 0.0–0.2)

## 2021-09-01 NOTE — ED Triage Notes (Signed)
Pt states he was working under house and landed hard on left arm, was seen at [facility] but was discharged, pt c/o continued pain in left shoulder/arm and radiating into upper back today. Pt denies dizziness, N/V/D, SOB. Pt reports dry cough that started before fall. Pt is AOX4, in NAD, breathing even and unlabored at this time. Pt has not taken OTC meds for pain, denies cardiac history.

## 2021-10-22 ENCOUNTER — Ambulatory Visit: Payer: Medicare HMO | Admitting: Podiatry

## 2022-10-10 DIAGNOSIS — Z833 Family history of diabetes mellitus: Secondary | ICD-10-CM | POA: Diagnosis not present

## 2022-10-10 DIAGNOSIS — N182 Chronic kidney disease, stage 2 (mild): Secondary | ICD-10-CM | POA: Diagnosis not present

## 2022-10-10 DIAGNOSIS — Z8673 Personal history of transient ischemic attack (TIA), and cerebral infarction without residual deficits: Secondary | ICD-10-CM | POA: Diagnosis not present

## 2022-10-10 DIAGNOSIS — K219 Gastro-esophageal reflux disease without esophagitis: Secondary | ICD-10-CM | POA: Diagnosis not present

## 2022-10-10 DIAGNOSIS — E1142 Type 2 diabetes mellitus with diabetic polyneuropathy: Secondary | ICD-10-CM | POA: Diagnosis not present

## 2022-10-10 DIAGNOSIS — E785 Hyperlipidemia, unspecified: Secondary | ICD-10-CM | POA: Diagnosis not present

## 2022-10-10 DIAGNOSIS — Z961 Presence of intraocular lens: Secondary | ICD-10-CM | POA: Diagnosis not present

## 2022-10-10 DIAGNOSIS — Z87891 Personal history of nicotine dependence: Secondary | ICD-10-CM | POA: Diagnosis not present

## 2022-10-10 DIAGNOSIS — Z7984 Long term (current) use of oral hypoglycemic drugs: Secondary | ICD-10-CM | POA: Diagnosis not present

## 2022-10-10 DIAGNOSIS — Z8249 Family history of ischemic heart disease and other diseases of the circulatory system: Secondary | ICD-10-CM | POA: Diagnosis not present

## 2022-10-10 DIAGNOSIS — L400 Psoriasis vulgaris: Secondary | ICD-10-CM | POA: Diagnosis not present

## 2022-10-10 DIAGNOSIS — R32 Unspecified urinary incontinence: Secondary | ICD-10-CM | POA: Diagnosis not present

## 2022-10-10 DIAGNOSIS — Z85828 Personal history of other malignant neoplasm of skin: Secondary | ICD-10-CM | POA: Diagnosis not present

## 2022-10-10 DIAGNOSIS — M4800 Spinal stenosis, site unspecified: Secondary | ICD-10-CM | POA: Diagnosis not present

## 2022-10-10 DIAGNOSIS — N529 Male erectile dysfunction, unspecified: Secondary | ICD-10-CM | POA: Diagnosis not present

## 2022-10-10 DIAGNOSIS — Z7982 Long term (current) use of aspirin: Secondary | ICD-10-CM | POA: Diagnosis not present

## 2022-10-10 DIAGNOSIS — G250 Essential tremor: Secondary | ICD-10-CM | POA: Diagnosis not present

## 2022-10-10 DIAGNOSIS — I129 Hypertensive chronic kidney disease with stage 1 through stage 4 chronic kidney disease, or unspecified chronic kidney disease: Secondary | ICD-10-CM | POA: Diagnosis not present

## 2022-10-14 DIAGNOSIS — G250 Essential tremor: Secondary | ICD-10-CM | POA: Diagnosis not present

## 2022-10-14 DIAGNOSIS — E119 Type 2 diabetes mellitus without complications: Secondary | ICD-10-CM | POA: Diagnosis not present

## 2022-10-14 DIAGNOSIS — Z0131 Encounter for examination of blood pressure with abnormal findings: Secondary | ICD-10-CM | POA: Diagnosis not present

## 2022-10-14 DIAGNOSIS — Z125 Encounter for screening for malignant neoplasm of prostate: Secondary | ICD-10-CM | POA: Diagnosis not present

## 2022-10-14 DIAGNOSIS — Z712 Person consulting for explanation of examination or test findings: Secondary | ICD-10-CM | POA: Diagnosis not present

## 2022-10-14 DIAGNOSIS — N401 Enlarged prostate with lower urinary tract symptoms: Secondary | ICD-10-CM | POA: Diagnosis not present

## 2022-10-14 DIAGNOSIS — E785 Hyperlipidemia, unspecified: Secondary | ICD-10-CM | POA: Diagnosis not present

## 2022-10-14 DIAGNOSIS — Z1389 Encounter for screening for other disorder: Secondary | ICD-10-CM | POA: Diagnosis not present

## 2022-10-14 DIAGNOSIS — Z0130 Encounter for examination of blood pressure without abnormal findings: Secondary | ICD-10-CM | POA: Diagnosis not present

## 2022-10-14 DIAGNOSIS — I10 Essential (primary) hypertension: Secondary | ICD-10-CM | POA: Diagnosis not present

## 2022-10-14 DIAGNOSIS — L409 Psoriasis, unspecified: Secondary | ICD-10-CM | POA: Diagnosis not present

## 2022-10-15 DIAGNOSIS — L570 Actinic keratosis: Secondary | ICD-10-CM | POA: Diagnosis not present

## 2022-12-17 DIAGNOSIS — D225 Melanocytic nevi of trunk: Secondary | ICD-10-CM | POA: Diagnosis not present

## 2022-12-17 DIAGNOSIS — Z08 Encounter for follow-up examination after completed treatment for malignant neoplasm: Secondary | ICD-10-CM | POA: Diagnosis not present

## 2022-12-17 DIAGNOSIS — L814 Other melanin hyperpigmentation: Secondary | ICD-10-CM | POA: Diagnosis not present

## 2022-12-17 DIAGNOSIS — Z85828 Personal history of other malignant neoplasm of skin: Secondary | ICD-10-CM | POA: Diagnosis not present

## 2022-12-17 DIAGNOSIS — L570 Actinic keratosis: Secondary | ICD-10-CM | POA: Diagnosis not present

## 2022-12-17 DIAGNOSIS — L821 Other seborrheic keratosis: Secondary | ICD-10-CM | POA: Diagnosis not present

## 2022-12-26 DIAGNOSIS — E119 Type 2 diabetes mellitus without complications: Secondary | ICD-10-CM | POA: Diagnosis not present

## 2022-12-26 DIAGNOSIS — Z961 Presence of intraocular lens: Secondary | ICD-10-CM | POA: Diagnosis not present

## 2022-12-26 DIAGNOSIS — H43813 Vitreous degeneration, bilateral: Secondary | ICD-10-CM | POA: Diagnosis not present

## 2023-02-19 DIAGNOSIS — G250 Essential tremor: Secondary | ICD-10-CM | POA: Diagnosis not present

## 2023-02-19 DIAGNOSIS — E785 Hyperlipidemia, unspecified: Secondary | ICD-10-CM | POA: Diagnosis not present

## 2023-02-19 DIAGNOSIS — Z712 Person consulting for explanation of examination or test findings: Secondary | ICD-10-CM | POA: Diagnosis not present

## 2023-02-19 DIAGNOSIS — Z0131 Encounter for examination of blood pressure with abnormal findings: Secondary | ICD-10-CM | POA: Diagnosis not present

## 2023-02-19 DIAGNOSIS — G629 Polyneuropathy, unspecified: Secondary | ICD-10-CM | POA: Diagnosis not present

## 2023-02-19 DIAGNOSIS — I10 Essential (primary) hypertension: Secondary | ICD-10-CM | POA: Diagnosis not present

## 2023-02-19 DIAGNOSIS — Z1331 Encounter for screening for depression: Secondary | ICD-10-CM | POA: Diagnosis not present

## 2023-02-19 DIAGNOSIS — Z1389 Encounter for screening for other disorder: Secondary | ICD-10-CM | POA: Diagnosis not present

## 2023-02-19 DIAGNOSIS — E119 Type 2 diabetes mellitus without complications: Secondary | ICD-10-CM | POA: Diagnosis not present

## 2023-05-23 IMAGING — CT CT HEAD W/O CM
3 series · 16 of 47 positions shown, 19 images · non-contrast
Comparison: None.

CLINICAL DATA: Neuro deficit, acute stroke suspected.

EXAM:
CT HEAD WITHOUT CONTRAST
TECHNIQUE: Contiguous axial images were obtained from the base of the skull
through the vertex without intravenous contrast.

[Series 3: head wo · axial · 0.45mm/px · z∈[-190,-60]mm · 10 of 32 slices shown, 13 images]
[im 3/32  brain]
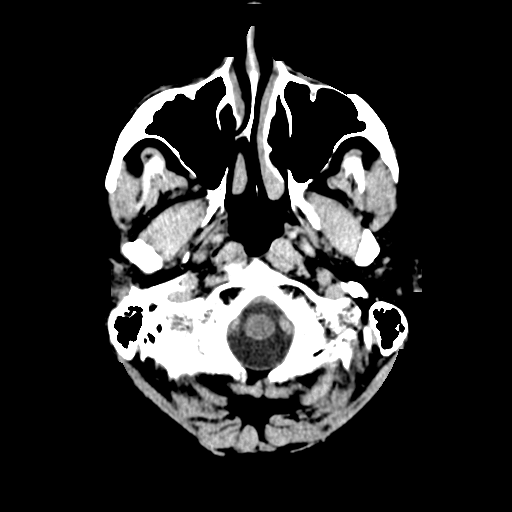
[im 3/32  bone]
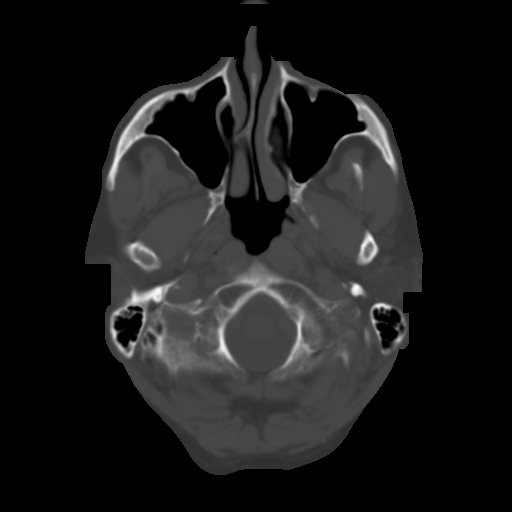
[im 6/32  brain]
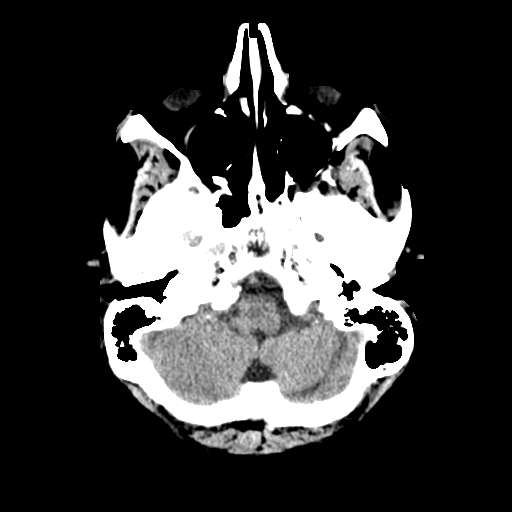
[im 9/32  brain]
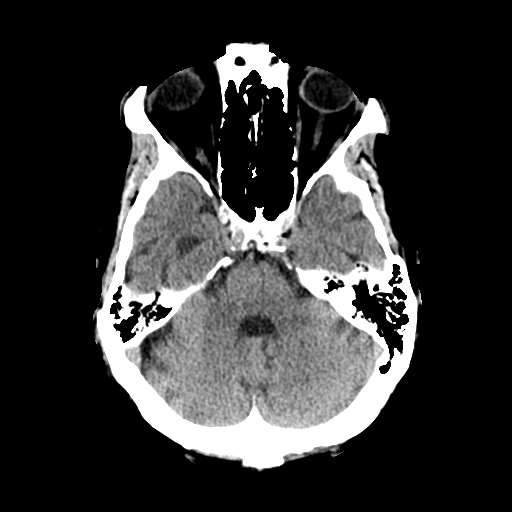
[im 11/32  brain]
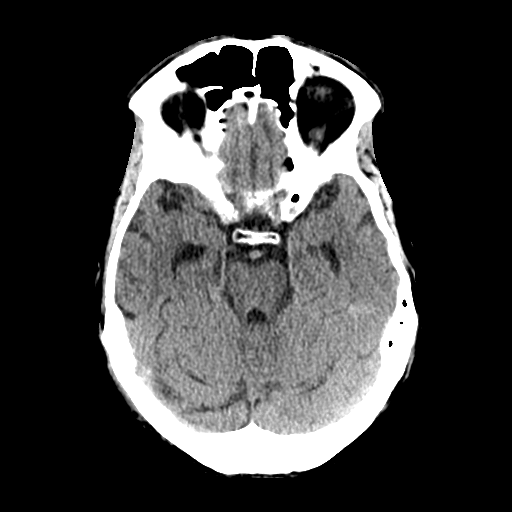
[im 14/32  brain]
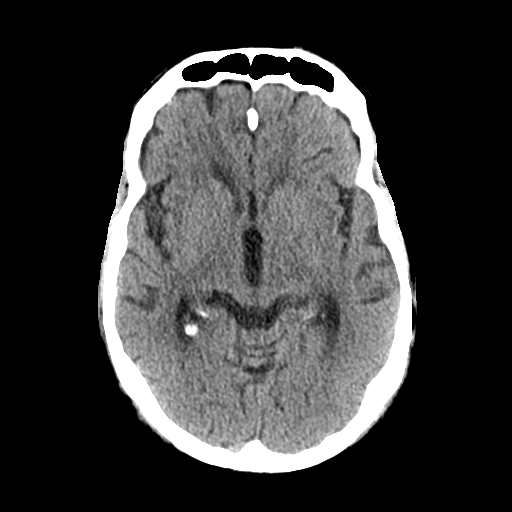
[im 14/32  bone]
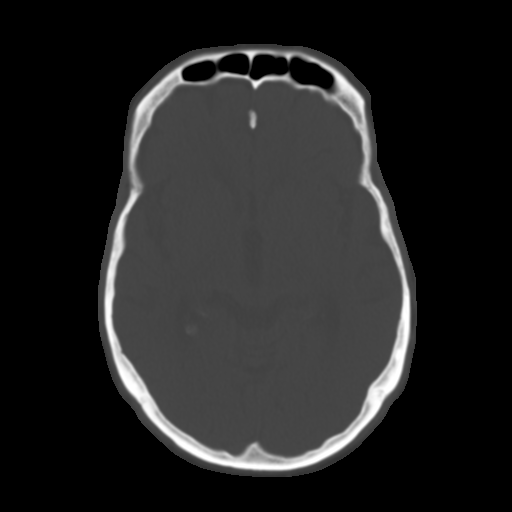
[im 18/32  brain]
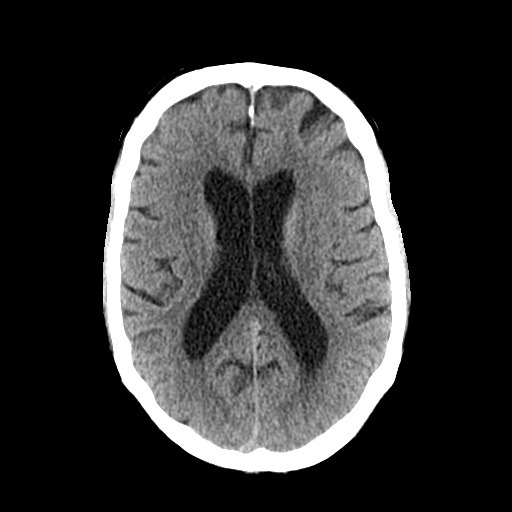
[im 21/32  brain]
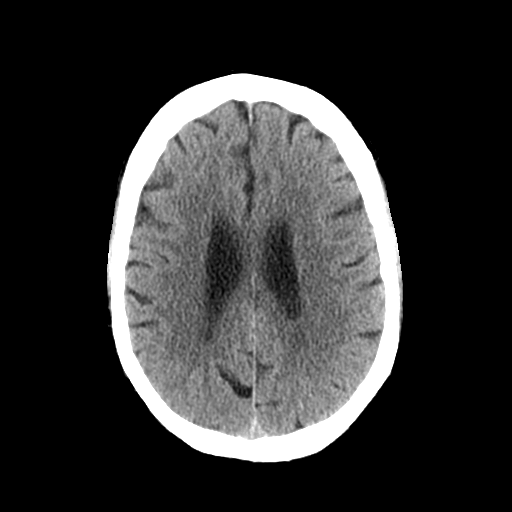
[im 24/32  brain]
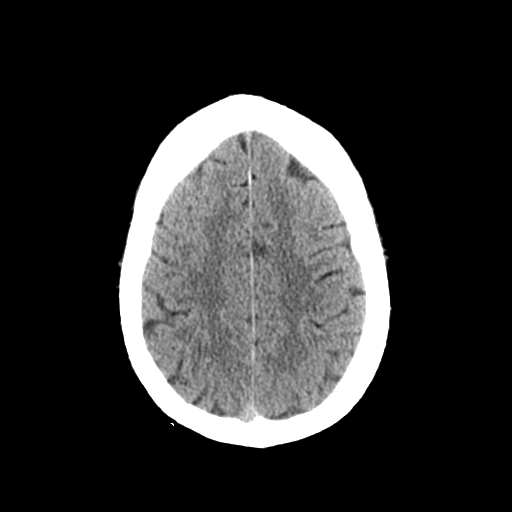
[im 26/32  brain]
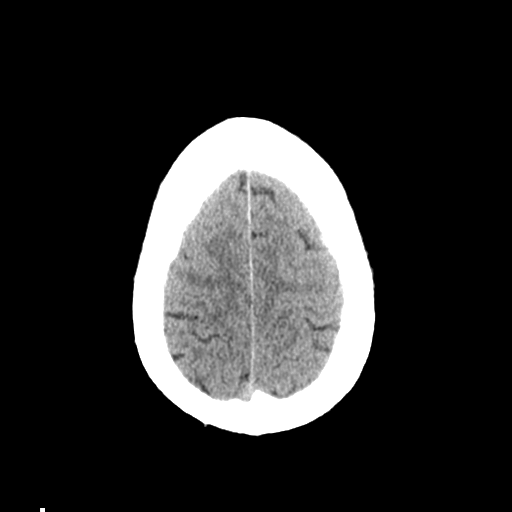
[im 26/32  bone]
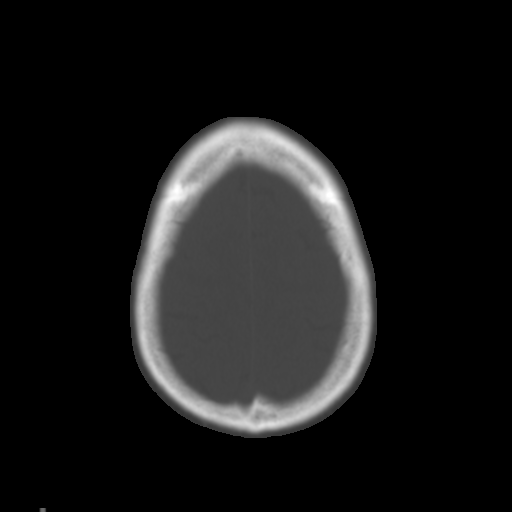
[im 29/32  brain]
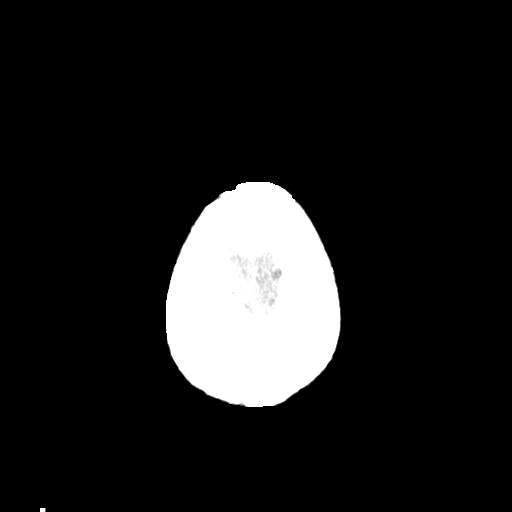

[Series 4: coronal soft tissue · coronal · 0.34mm/px · 3 of 74 slices shown]
[im 25/74  brain]
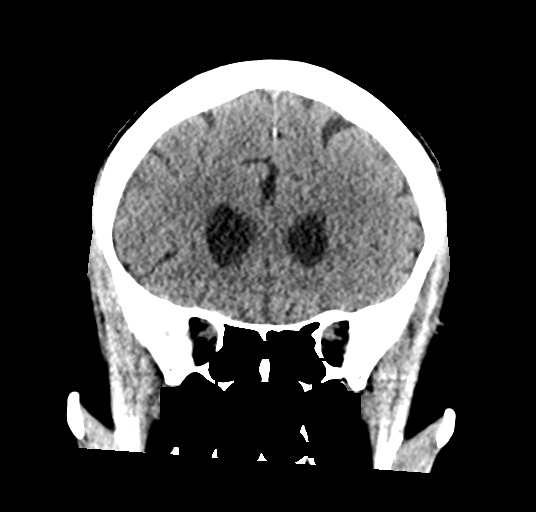
[im 33/74  brain]
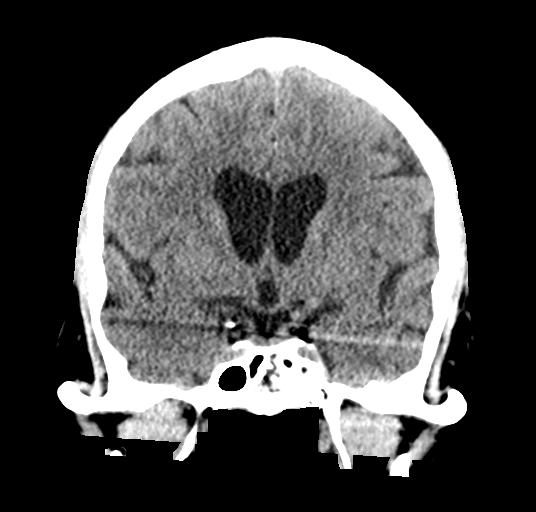
[im 41/74  brain]
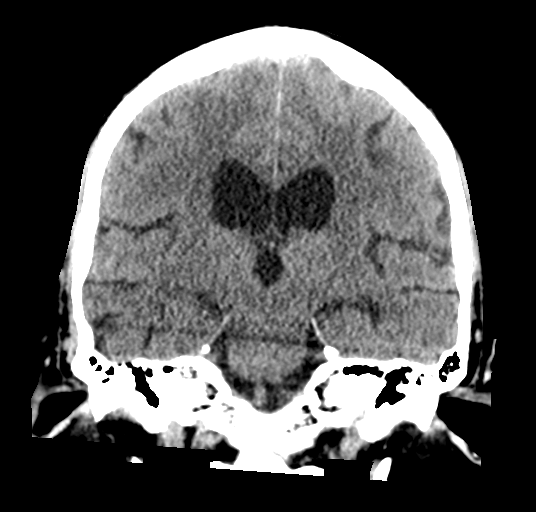

[Series 5: sagittal soft tissue · sagittal · 0.34mm/px · 3 of 53 slices shown]
[im 18/53  brain]
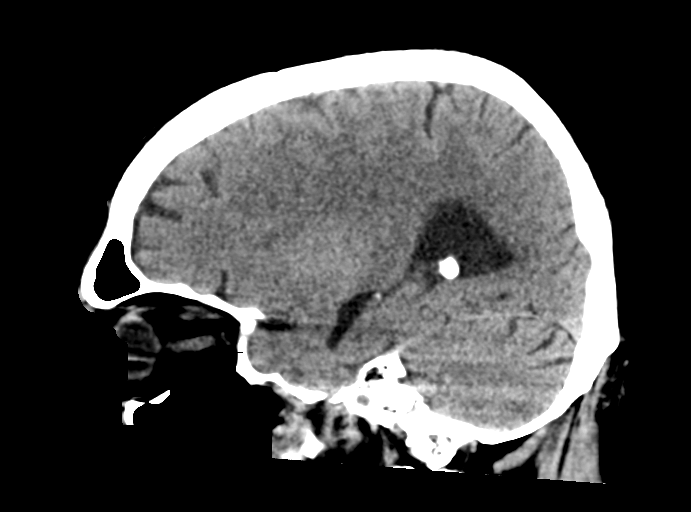
[im 27/53  brain]
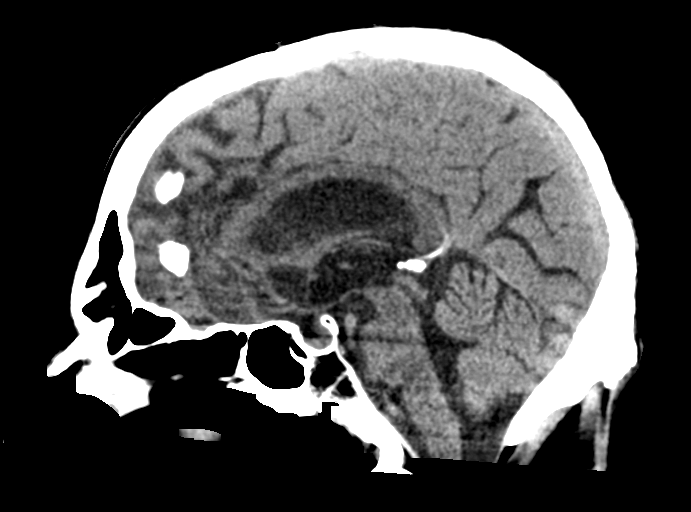
[im 35/53  brain]
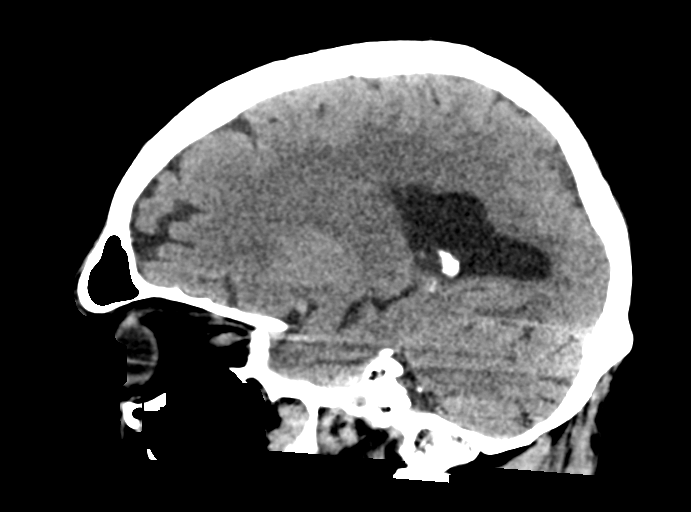

[16 of 47 positions shown; findings below may reference images not displayed]

FINDINGS: Brain: No evidence of acute large vascular territory infarction,
hemorrhage, hydrocephalus, extra-axial collection or mass
lesion/mass effect. Mild to moderate atrophy with ex vacuo
ventricular dilation.

Vascular: No hyperdense vessel. Calcific intracranial
atherosclerosis.

Skull: No acute fracture.

Sinuses/Orbits: Visualized sinuses are clear.

Other: No mastoid effusions.
IMPRESSION: No evidence of acute intracranial abnormality.

## 2023-07-03 NOTE — ED Provider Notes (Signed)
 ------------------------------------------------------------------------------- Attestation signed by Cathryne Morene Marsh, DO at 07/04/23 [phone] I was personally available for consultation in the emergency department. I reviewed the chart and agree with the documentation recorded. Including assessment, treatment plan and disposition. -------------------------------------------------------------------------------                            [facility] Emergency Department  Date of Service:  07/03/2023 Chief Complaint  Patient presents with  . Dysuria    Began today with pain with urination, dark urine and going more frequently for a few days    Garrett Henderson is an 83 yo male with a medical hx of DM with neuropathy, HTN, kidney stones, chronic lower back pain, partial nephrectomy, skin cancer, TIA and Parkinson's that presents to the ED for c/o urinary frequency, abdominal pain and dark urine that started this morning.  Pt is concerned about a kidney stone and has seen blood with voiding.  No fever, chills or n/v.        Past Medical History:  Diagnosis Date  . Diabetes (CMS-HCC)   . Hypertension   . Kidney stones   . Neuropathy   . Skin cancer   . TIA (transient ischemic attack)    Past Surgical History:  Procedure Laterality Date  . PR BIOPSY OF SKIN LESION    . SUR - BIOPSY OF PROSTATE    . SUR - CHOLECYSTECTOMY    . SUR - NEPHRECTOMY PARTIAL    . SUR - OTHER: SPECIFY IN COMMENTS     R arm fracture repair  . SUR - OTHER: SPECIFY IN COMMENTS     Kidney stone removal  . SUR - REPAIR OF ARTERY KIDNEY    . SUR - REPAIR OF HERNIA     x 2   Social History   Socioeconomic History  . Marital status: Widowed    Spouse name: Not on file  . Number of children: Not on file  . Years of education: Not on file  . Highest education level: Not on file  Occupational History  . Not on file  Tobacco Use  . Smoking status: Former    Types: Cigarettes  . Smokeless  tobacco: Never  Vaping Use  . Vaping status: Never Used  Substance and Sexual Activity  . Alcohol use: Not Currently  . Drug use: Never  . Sexual activity: Not on file   No family history on file.  Review of Systems  Constitutional:  Negative for activity change, appetite change, chills and fever.  Gastrointestinal:  Positive for abdominal pain. Negative for nausea and vomiting.  Genitourinary:  Positive for dysuria, frequency and hematuria.  All other systems reviewed and are negative.    ED Triage Vitals [07/03/23 1828]  Encounter Vitals Group     BP (!) 144/103     Systolic BP Percentile      Diastolic BP Percentile      Pulse 80     Resp 16     Temp 36.5 C (97.7 F)     Temp src      SpO2 97 %     Weight 81.3 kg (179 lb 3.2 oz)     Height 1.727 m (5' 8)     Head Circumference      Peak Flow      Pain Score      Pain Loc      Pain Education      Exclude  from Growth Chart    I performed a physical exam and reassessment Ideal body weight: 68.4 kg (150 lb 12.7 oz) Adjusted ideal body weight: 73.6 kg (162 lb 2.5 oz) Physical Exam Vitals and nursing note reviewed.  Constitutional:      General: He is not in acute distress.    Appearance: Normal appearance. He is well-developed.  HENT:     Head: Normocephalic and atraumatic.  Eyes:     Conjunctiva/sclera: Conjunctivae normal.  Cardiovascular:     Rate and Rhythm: Normal rate and regular rhythm.     Heart sounds: Normal heart sounds. No murmur heard. Pulmonary:     Effort: Pulmonary effort is normal. No respiratory distress.     Breath sounds: Normal breath sounds.  Abdominal:     Palpations: Abdomen is soft.     Tenderness: There is abdominal tenderness.     Comments: SP  Musculoskeletal:        General: Normal range of motion.     Cervical back: Normal range of motion and neck supple.  Skin:    General: Skin is warm and dry.  Neurological:     Mental Status: He is alert and oriented to person, place,  and time.     Sensory: No sensory deficit.     Comments: +cane and upper extremity tremor   Psychiatric:        Behavior: Behavior normal. Behavior is cooperative.       Results:  Results for orders placed or performed during the hospital encounter of 07/03/23 (from the past 24 hours)  (PANEL)-URINALYSIS, COMPLETE   Narrative   The following orders were created for panel order (PANEL)-URINALYSIS, COMPLETE. Procedure                               Abnormality         Status                    ---------                               -----------         ------                    URINALYSIS, COMPLETE[[id number]]         Abnormal            Final result              URINALYSIS, MICROSCOPIC[[id number]]      Abnormal            Final result               Please view results for these tests on the individual orders.  URINALYSIS, COMPLETE  Test Value Low-High   Color, Urine Orange (A) Colorless, Yellow, or Straw   Clarity, Urine Cloudy (A) Clear   Specific Gravity, Urine 1.025 (H) 1.010 - 1.023   pH, Urine 6.0 5.0 - 8.0   Hemoglobin, Urine 3+ (A) Negative   Bilirubin, Urine Negative Negative   Urobilinogen, Urine Normal Normal   Ketones, Urine Negative Negative   Glucose, Urine 3+ (A) Negative   Nitrites, Urine Positive (A) Negative   Leukocyte Esterase, Urine 1+ (A) Negative   Protein, Urine 2+ (A) Negative  URINALYSIS, MICROSCOPIC  Test Value Low-High   RBC, URINE Too Numerous To  Count (A) None Seen /HPF   WBC, Urine 20-50 (A) None Seen /HPF   Squamous Epithelial Cells, Urine None Seen None Seen /HPF   Bacteria, Urine Many (A) None Seen  (PANEL)-URINALYSIS, COMPLETE *Canceled*   Narrative   The following orders were created for panel order (PANEL)-URINALYSIS, COMPLETE. Procedure                               Abnormality         Status                    ---------                               -----------         ------                     Please view results for these tests on the  individual orders.  GLUCOSE, GLUCOMETER  Test Value Low-High   Glucose 236 (H) 70 - 100 mg/dL  (PANEL)-CBC WITH DIFFERENTIAL   Narrative   The following orders were created for panel order (PANEL)-CBC WITH DIFFERENTIAL. Procedure                               Abnormality         Status                    ---------                               -----------         ------                    CBC WITH DIFFERENTIAL[[id number]]        Abnormal            Final result               Please view results for these tests on the individual orders.  BASIC METABOLIC PANEL  Test Value Low-High   BUN 21 8 - 26 mg/dL   Sodium 859 863 - 854 mEq/L   Potassium 4.5 3.5 - 5.1 mEq/L   Chloride 103 98 - 107 mEq/L   CO2 23 23 - 31 mEq/L   Anion Gap 14 (H) 4 - 12 mEq/L   Glucose 212 (H) 70 - 105 mg/dL   Creatinine 8.74 9.27 - 1.25 mg/dL   Glomerular Filtration Rate 58 (L) >=59 mL/Min/1.73 m2   Calcium 9.9 8.4 - 10.2 mg/dL   Osmo (Calc'd) 698 mOsm/kg   Alw:Rmzju Ratio 16.80   CBC WITH DIFFERENTIAL  Test Value Low-High   WBC (White Blood Cell Count) 14.68 (H) 4.50 - 11.00 k/uL   RBC (Red Blood Cell Count) 5.36 4.40 - 5.90 M/uL   Hemoglobin 15.3 13.0 - 18.0 g/dL   Hematocrit 54.9 59.9 - 52.0 %   MCV (Mean Corpuscular Volume) 84.0 80.0 - 100.0 fL   MCH (Mean Corpuscular Hemoglobin) 28.5 26.0 - 34.0 pg   MCHC (Mean Corpuscular Hemoglobin Concentration) 34.0 32.0 - 36.0 g/dL   RDW (Red Cell Distribution Width) 13.2 11.5 - 14.5 %   Platelet Count 211 150 - 440 k/uL   MPV (  Mean Platelet Volume) 10.0 7.4 - 10.6 fL   Neutrophils (%) 81 %   Lymphocytes (%) 12 %   Monocytes (%) 5 %   Eosinophils (%) 2 %   Basophils (%) 0 %   Immature Granulocytes (%) 0 %   Absolute Neutrophils (#) 11.83 (H) 1.80 - 7.70 k/uL   Absolute Lymphocytes (#) 1.78 1.00 - 4.80 k/uL   Absolute Monocytes (#) 0.76 0.00 - 0.80 k/uL   Absolute Eosinophils (#) 0.24 0.00 - 0.50 k/uL   Absolute Basophils (#) 0.02 0.00 - 0.20 k/uL   Absolute  Immature Granulocytes (#) 0.05 (H) 0 k/uL  LACTIC ACID  Test Value Low-High   Lactic Acid 1.3 0.5 - 2.0 mmol/L  CT ABDOMEN PELVIS W/O IV CONTRAST   Narrative   CT OF THE ABDOMEN AND PELVIS WITHOUT CONTRAST: 07/03/2023  HISTORY: Abdominal pain, hematuria, hx of kidney stones.   TECHNIQUE: A noncontrast CT scan through the abdomen and pelvis was performed.   COMPARISON: None available  FINDINGS:   There are a few 1 to 2 mm bilateral nonobstructing renal calculi. Suspected surgical clips or embolization coils right renal hilum. No ureteral stones or obstructive uropathy. Right renal atrophy. Multifocal renal parenchymal scarring both kidneys.  Parapelvic left renal cysts present. Moderately severe prostatomegaly. Urinary bladder appears thickened and trabeculated with small bladder diverticuli. Mild nonspecific perinephric stranding bilaterally.  Minimal dependent bibasilar atelectasis. No pleural or pericardial effusions. Heart normal in size. Coronary artery and aortic atherosclerotic disease. Small hiatal hernia. Status post cholecystectomy without biliary ductal dilatation.  The liver, spleen, pancreas, and adrenal glands appear normal without the benefit of IV contrast. No dilated loops of small or large bowel. Normal appendix. Mild colonic diverticulosis without diverticulitis.  Status post partial small bowel resection with staple line seen within the anterior pelvis. Aortoiliac atherosclerosis. No significant free fluid or lymphadenopathy within the abdomen or pelvis. No intra-abdominal abscess or pneumoperitoneum.  No significant inguinal lymphadenopathy. Small umbilical hernia contains fat. No suspicious osseous lesions. Degenerative changes of the hips and spine.    Impression   IMPRESSION:  1. Small bilateral nonobstructing renal calculi measuring 1 to 2 mm. No ureteral stones or obstructive uropathy.  2. Moderate prostatomegaly. Thick-walled trabeculated urinary bladder with  small bladder diverticuli present may reflect sequelae of bladder outlet obstruction.  3. Right renal atrophy. Renal protocol scarring bilaterally. Parapelvic left renal cysts present.  4. Coronary artery and aortic atherosclerotic disease.   5. Status post cholecystectomy without biliary ductal dilatation.  6. Status post partial small bowel resection. Colonic diverticulosis without diverticulitis. Normal appendix.  7. Other findings and description as above.        Reading Doctor: Begelman, Keith  Electronic Signature by: Wanna Eck      Medications Ordered/Adminstered During This ED Visit:  Medications  cefTRIAXone (ROCEPHIN) 2 g in sodium chloride  0.9 % 100 mL IVPB (0 g Intravenous Stopped 07/03/23 2034)     EKG (Interpretation if ordered):       ASSESSMENT / MEDICAL DECISION MAKING I have reviewed the past medical, family, social history sections: including the medications, nursing notes, vital signs, and allergies. Independent review and interpretation of results as below in MDM/ED Course.    Medical Decision Making  ED COURSE    ED Course as of 07/03/23 2119  Thu Jul 03, 2023  1949 WBC(!): 14.68 [SL]  1949 Neutrophil (#)(!): 11.83 [SL]  1949 Immature Granulocytes #(!): 0.05 [SL]  1949 Anion Gap (calc.)(!): 14 [SL]  1949 Glucose(!):  212 [SL]  1949 Glomerular Filtration Rate(!): 58 [SL]  1949 Color, urine(!): Orange [SL]  1949 Appearance, urine(!): Cloudy [SL]  1949 Specific Gravity, Urine(!): 1.025 [SL]  1949 Hemoglobin, urine(!): 3+ [SL]  1949 Glucose, urine(!): 3+ [SL]  1949 Nitrites, urine(!): Positive [SL]  1949 Leukocyte Esterase, urine(!): 1+ [SL]  1949 Protein, urine(!): 2+ [SL]  1949 RBC, urine(!): Too Numerous To Count [SL]  1949 WBC, urine(!): 20-50 [SL]  1949 Bacteria, Urine(!): Many [SL]  2018 IMPRESSION:   1. Small bilateral nonobstructing renal calculi measuring 1 to 2 mm. No ureteral stones or obstructive uropathy.   2. Moderate  prostatomegaly. Thick-walled trabeculated urinary bladder with small bladder diverticuli present may reflect sequelae of bladder outlet obstruction.   3. Right renal atrophy. Renal protocol scarring bilaterally. Parapelvic left renal cysts present.   4. Coronary artery and aortic atherosclerotic disease.    5. Status post cholecystectomy without biliary ductal dilatation.   6. Status post partial small bowel resection. Colonic diverticulosis without diverticulitis. Normal appendix.   7. Other findings and description as above.  [SL]    ED Course User Index [SL] Layden, Sandra [place], FNP      Clinical Impressions as of 07/03/23 2119  Urinary tract infection with hematuria, site unspecified  Hyperglycemia  Nephrolithiasis  Benign prostatic hyperplasia, unspecified whether lower urinary tract symptoms present     Pt presents with UTI sxs and has +nitrates, 1+ leuks, many bacteria and 20-50 WBCs.  He was given Rocephin IV, will start keflex and has a urine cx that is pending.  His CT was negative for an active kidney stone, he does have bilateral nephrolithiasis.  Also discussed his BPH, bladder diverticula and this may reflect an bladder outlet obstruction.  Pt is voiding with no retention.  He also has mild leukocytosis, a normal lactic and hyperglycemia    MEDICATIONS ADMINISTERED DURING THIS ED VISIT: Medications  cefTRIAXone (ROCEPHIN) 2 g in sodium chloride  0.9 % 100 mL IVPB (0 g Intravenous Stopped 07/03/23 2034)    MEDICATIONS: Listed below are any new, modified or discontinued meds.  The previous medications are the medications which the patient was taking upon presentation and were not altered or changed unless otherwise specified. Discharge Medication List as of 07/03/2023  8:35 PM     START taking these medications   Details  cephALEXin (KEFLEX) 500 mg Oral Capsule Take 1 Capsule by mouth twice a day for 7 days., Disp-14 Capsule, R-0, Normal       CONTINUE these  medications which have NOT CHANGED   Details  !! gabapentin (NEURONTIN) 100 mg Oral Capsule Take 1 Capsule by mouth daily., Historical Med    !! gabapentin (NEURONTIN) 100 mg Oral Capsule Take 2 Capsules by mouth at bedtime., Historical Med    glipiZIDE (GLUCOTROL) 10 mg Oral Tablet Take 1 Tablet by mouth daily., Historical Med    lisinopriL (PRINIVIL,ZESTRIL) 20 mg Oral Tablet Take 1 Tablet by mouth daily., Historical Med    propranoloL (INDERAL) 60 mg Oral Tablet Take 1 Tablet by mouth every 12 hours., Historical Med    rosuvastatin (CRESTOR) 10 mg Oral Tablet Take 1 Tablet by mouth at bedtime., Historical Med     !! - Potential duplicate medications found. Please discuss with provider.      Patient and I reviewed signs/symptoms of worsening or progressing illness that could develop, and would require seeking urgent or emergent care. Patient expressed understanding.  Patient verbalized understanding of instructions and all things reviewed today.  THE PATIENT WAS INSTRUCTED TO FOLLOWUP AS LISTED BELOW: your PCP next week    No improvement or sooner if worsening  DATE OF SERVICE:  07/03/2023   ED Provider: Sandra J Layden, FNP

## 2023-07-09 ENCOUNTER — Encounter: Payer: Self-pay | Admitting: Urology

## 2023-07-09 ENCOUNTER — Ambulatory Visit: Admitting: Urology

## 2023-07-09 VITALS — BP 158/75 | HR 70 | Ht 68.0 in | Wt 181.0 lb

## 2023-07-09 DIAGNOSIS — Z842 Family history of other diseases of the genitourinary system: Secondary | ICD-10-CM

## 2023-07-09 DIAGNOSIS — N138 Other obstructive and reflux uropathy: Secondary | ICD-10-CM

## 2023-07-09 DIAGNOSIS — N401 Enlarged prostate with lower urinary tract symptoms: Secondary | ICD-10-CM | POA: Diagnosis not present

## 2023-07-09 DIAGNOSIS — N200 Calculus of kidney: Secondary | ICD-10-CM | POA: Diagnosis not present

## 2023-07-09 DIAGNOSIS — R310 Gross hematuria: Secondary | ICD-10-CM

## 2023-07-09 DIAGNOSIS — Z87438 Personal history of other diseases of male genital organs: Secondary | ICD-10-CM

## 2023-07-09 LAB — BLADDER SCAN AMB NON-IMAGING: PVR: 213 WU

## 2023-07-14 NOTE — Progress Notes (Signed)
 07/09/2023 11:21 AM   Garrett Henderson 1940/08/28 [id number]  Referring provider: Christi Vannie JINNY, MD [address] [place],  [place] [address]  Urological history: 1.  High risk hematuria - Former smoker - non contrast CT (2023) -left nephrolithiasis, prostatomegaly with a small bladder diverticulum - cysto was refused by patient  2.  Nephrolithiasis - non contrast CT (06/2023) -small bilateral nonobstructing renal calculi measuring 1 to 2 mm  3. BPH with LU TS  - TURP ~ 10 years ago  - non contrast CT (06/2023) moderate prostatomegaly, thick-walled trabeculated urinary bladder with a small bladder diverticuli  4. Right renal atrophy - non contrast CT (06/2023) - serum creatinine (06/2023) 1.25, eGFR 58  5. Left parapelvic renal cysts - non contrast CT (06/2023)   Chief Complaint  Patient presents with   Hematuria   HPI: Garrett Henderson is a 83 y.o. man who presents today for ED follow up.    Previous records reviewed.   He was seen at [facility] emergency department on July 03, 2023 for symptoms of dysuria, dark urine, gross heme and urinary frequency for the last few days.  He is UA was positive for nitrates, leukocytes and many bacteria.  He was given Rocephin IV and started on Keflex.  CT scan taken at that time just noted bilateral nonobstructing nephrolithiasis.    Urine culture was positive for E. Coli and he was switched to Cipro.  Today, his symptoms have almost completely resolved.  Patient denies any modifying or aggravating factors.  Patient denies any recent UTI's, gross hematuria, dysuria or suprapubic/flank pain.  Patient denies any fevers, chills, nausea or vomiting.    He is a former smoker, he does not drink alcohol and does not use illicit drugs.  Serum creatinine (06/2023) 1.25, eGFR 58  WBC count (06/2023) 14.68  PVR 213 mL   PMH: Past Medical History:  Diagnosis Date   Complication of anesthesia    Neuropathy     Stroke [facility])     Surgical History: Past Surgical History:  Procedure Laterality Date   CHOLECYSTECTOMY     COLONOSCOPY WITH PROPOFOL  N/A 05/10/2021   Procedure: COLONOSCOPY WITH PROPOFOL ;  Surgeon: Therisa Bi, MD;  Location: [facility] ENDOSCOPY;  Service: Gastroenterology;  Laterality: N/A;   HERNIA REPAIR     kidney stone     PROSTATE SURGERY      Home Medications:  Allergies as of 07/09/2023   No Known Allergies      Medication List        Accurate as of July 09, 2023 11:59 PM. If you have any questions, ask your nurse or doctor.          cephALEXin 500 MG capsule Commonly known as: KEFLEX Take 500 mg by mouth 2 (two) times daily.   Cipro 500 MG tablet Generic drug: ciprofloxacin Take 500 mg by mouth 2 (two) times daily.   diazepam  2 MG tablet Commonly known as: Valium  1 tab 30 minutes prior to procedure   gabapentin 100 MG capsule Commonly known as: NEURONTIN Take 100 mg by mouth 3 (three) times daily.   glipiZIDE 5 MG tablet Commonly known as: GLUCOTROL Take by mouth daily before breakfast.   ibuprofen  600 MG tablet Commonly known as: ADVIL  Take 1 tablet (600 mg total) by mouth every 8 (eight) hours as needed.   lisinopril 20 MG tablet Commonly known as: ZESTRIL Take 20 mg by mouth daily.   metFORMIN 1000 MG tablet Commonly  known as: GLUCOPHAGE Take 1,000 mg by mouth 2 (two) times daily with a meal.   pioglitazone 15 MG tablet Commonly known as: ACTOS Take 15 mg by mouth daily.   simvastatin 20 MG tablet Commonly known as: ZOCOR Take 20 mg by mouth daily.   sulfamethoxazole -trimethoprim  800-160 MG tablet Commonly known as: BACTRIM  DS Take 1 tablet by mouth 2 (two) times daily.        Allergies: No Known Allergies  Family History: History reviewed. No pertinent family history.  Social History: See HPI for pertinent social history  ROS: Pertinent ROS in HPI  Physical Exam: BP (!) 158/75   Pulse 70   Ht 5' 8 (1.727 m)   Wt 181 lb  (82.1 kg)   BMI 27.52 kg/m   Constitutional:  Well nourished. Alert and oriented, No acute distress. HEENT: [place] AT, moist mucus membranes.  Trachea midline Cardiovascular: No clubbing, cyanosis, or edema. Respiratory: Normal respiratory effort, no increased work of breathing. Neurologic: Grossly intact, no focal deficits, moving all 4 extremities. Psychiatric: Normal mood and affect.  Laboratory Data: See EPIC and HPI  I have reviewed the labs.   Pertinent Imaging: Component     Latest Ref Rng 07/09/2023  PVR     WU 213.0     Assessment & Plan:    1. Gross hematuria - Likely secondary to urinary tract infection - Will repeat UA in 1 month to ensure microscopic hematuria does not persist  2. BPH with LU TS - Bladder scan indicates incomplete bladder emptying - This could possibly set him up for more infections going forward, but we will discuss that when he returns in 1 month  3. Nephrolithiasis - Small punctate stones seen bilaterally on recent CT scan - Continue to follow clinically  Return in about 1 month (around 08/09/2023) for UA, PVR and symptom recheck .  These notes generated with voice recognition software. I apologize for typographical errors.  CLOTILDA HELON RIGGERS  [facility] [address]  [address] [place], [place] [address] [phone]

## 2023-07-18 ENCOUNTER — Ambulatory Visit: Admitting: Urology

## 2023-08-12 NOTE — Progress Notes (Unsigned)
 08/14/2023 10:11 AM   Debby JINNY Calkins February 02, 1940 [id number]  Referring provider: Christi Vannie JINNY, MD [address] [place],  [place] [address]  Urological history: 1.  High risk hematuria - Former smoker - non contrast CT (2023) -left nephrolithiasis, prostatomegaly with a small bladder diverticulum - cysto was refused by patient  2.  Nephrolithiasis - non contrast CT (06/2023) -small bilateral nonobstructing renal calculi measuring 1 to 2 mm  3. BPH with LU TS  - TURP ~ 10 years ago  - non contrast CT (06/2023) moderate prostatomegaly, thick-walled trabeculated urinary bladder with a small bladder diverticuli  4. Right renal atrophy - non contrast CT (06/2023) - serum creatinine (06/2023) 1.25, eGFR 58  5. Left parapelvic renal cysts - non contrast CT (06/2023)   Chief Complaint  Patient presents with   Follow-up   Hematuria   HPI: MARKEESE BOYAJIAN is a 83 y.o. man who presents today for one month follow up.    Previous records reviewed.   He feels good today.  He does have some urinary urgency, but it is not bothersome.  Patient denies any modifying or aggravating factors.  Patient denies any recent UTI's, gross hematuria, dysuria or suprapubic/flank pain.  Patient denies any fevers, chills, nausea or vomiting.    UA bland  PVR 223 mL  PMH: Past Medical History:  Diagnosis Date   Complication of anesthesia    Neuropathy    Stroke [facility])     Surgical History: Past Surgical History:  Procedure Laterality Date   CHOLECYSTECTOMY     COLONOSCOPY WITH PROPOFOL  N/A 05/10/2021   Procedure: COLONOSCOPY WITH PROPOFOL ;  Surgeon: Therisa Bi, MD;  Location: [facility] ENDOSCOPY;  Service: Gastroenterology;  Laterality: N/A;   HERNIA REPAIR     kidney stone     PROSTATE SURGERY      Home Medications:  Allergies as of 08/14/2023   No Known Allergies      Medication List        Accurate as of August 14, 2023 10:11 AM. If you have any questions, ask your nurse  or doctor.          cephALEXin 500 MG capsule Commonly known as: KEFLEX Take 500 mg by mouth 2 (two) times daily.   Cipro 500 MG tablet Generic drug: ciprofloxacin Take 500 mg by mouth 2 (two) times daily.   gabapentin 100 MG capsule Commonly known as: NEURONTIN Take 100 mg by mouth 3 (three) times daily.   glipiZIDE 5 MG tablet Commonly known as: GLUCOTROL Take by mouth daily before breakfast.   ibuprofen  600 MG tablet Commonly known as: ADVIL  Take 1 tablet (600 mg total) by mouth every 8 (eight) hours as needed.   lisinopril 20 MG tablet Commonly known as: ZESTRIL Take 20 mg by mouth daily.   metFORMIN 1000 MG tablet Commonly known as: GLUCOPHAGE Take 1,000 mg by mouth 2 (two) times daily with a meal.   pioglitazone 15 MG tablet Commonly known as: ACTOS Take 15 mg by mouth daily.   simvastatin 20 MG tablet Commonly known as: ZOCOR Take 20 mg by mouth daily.   sulfamethoxazole -trimethoprim  800-160 MG tablet Commonly known as: BACTRIM  DS Take 1 tablet by mouth 2 (two) times daily.        Allergies: No Known Allergies  Family History: No family history on file.  Social History: See HPI for pertinent social history  ROS: Pertinent ROS in HPI  Physical Exam: BP 125/76 (BP Location: Left Arm, Patient Position:  Sitting, Cuff Size: Normal)   Pulse 64   Ht 5' 8 (1.727 m)   Wt 180 lb (81.6 kg)   SpO2 99%   BMI 27.37 kg/m   Constitutional:  Well nourished. Alert and oriented, No acute distress. HEENT: [place] AT, moist mucus membranes.  Trachea midline Cardiovascular: No clubbing, cyanosis, or edema. Respiratory: Normal respiratory effort, no increased work of breathing. Neurologic: Grossly intact, no focal deficits, moving all 4 extremities. Psychiatric: Normal mood and affect.   Laboratory Data: See EPIC and HPI  I have reviewed the labs.   Pertinent Imaging:  08/14/23 09:56  Scan Result     Assessment & Plan:    1. Gross  hematuria - Likely secondary to urinary tract infection - No further reports of gross heme  - UA negative for micro heme   2. BPH with LU TS - Bladder scan indicates incomplete bladder emptying  - We discussed that we expect this as individuals get older and this may or may not set him up for infections in the future - We discussed starting an alpha-blocker to help facilitate better bladder emptying, but he deferred stating he is taking too much medicine now - I encouraged him to increase fluid intake and not to delay bladder emptying and to contact us  if he should start experiencing UTI symptoms or gross hematuria or more difficulty emptying his bladder in the future  3. Nephrolithiasis - Small punctate stones seen bilaterally on recent CT scan - Continue to follow clinically  Return in about 1 year (around 08/13/2024) for KUB, UA, PVR .  These notes generated with voice recognition software. I apologize for typographical errors.  CLOTILDA HELON RIGGERS  [facility] [address]  [address] [place], [place] [address] [phone]

## 2023-08-14 ENCOUNTER — Encounter: Payer: Self-pay | Admitting: Urology

## 2023-08-14 ENCOUNTER — Ambulatory Visit: Admitting: Urology

## 2023-08-14 VITALS — BP 125/76 | HR 64 | Ht 68.0 in | Wt 180.0 lb

## 2023-08-14 DIAGNOSIS — N200 Calculus of kidney: Secondary | ICD-10-CM | POA: Diagnosis not present

## 2023-08-14 DIAGNOSIS — N401 Enlarged prostate with lower urinary tract symptoms: Secondary | ICD-10-CM | POA: Diagnosis not present

## 2023-08-14 DIAGNOSIS — R310 Gross hematuria: Secondary | ICD-10-CM | POA: Diagnosis not present

## 2023-08-14 DIAGNOSIS — N138 Other obstructive and reflux uropathy: Secondary | ICD-10-CM

## 2023-08-14 LAB — URINALYSIS, COMPLETE
Bilirubin, UA: NEGATIVE
Glucose, UA: NEGATIVE
Ketones, UA: NEGATIVE
Leukocytes,UA: NEGATIVE
Nitrite, UA: NEGATIVE
Protein,UA: NEGATIVE
RBC, UA: NEGATIVE
Specific Gravity, UA: 1.015 (ref 1.005–1.030)
Urobilinogen, Ur: 0.2 mg/dL (ref 0.2–1.0)
pH, UA: 6 (ref 5.0–7.5)

## 2023-08-14 LAB — MICROSCOPIC EXAMINATION

## 2023-08-14 LAB — BLADDER SCAN AMB NON-IMAGING

## 2024-03-26 IMAGING — CT CT RENAL STONE PROTOCOL
2 of 4 series · 16 of 46 positions shown, 18 images · non-contrast
Comparison: None.

CLINICAL DATA: Hematuria.   prostate surgery.



[Series 2: stone full standard · axial · 0.85mm/px · z∈[-992,-552]mm · 13 of 98 slices shown, 15 images]
[im 5/98  soft-tissue]
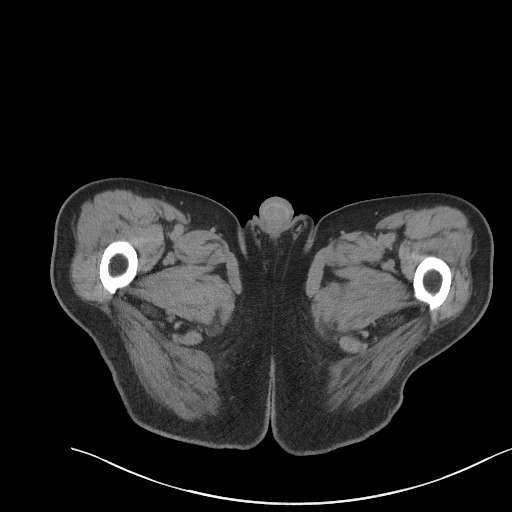
[im 5/98  bone]
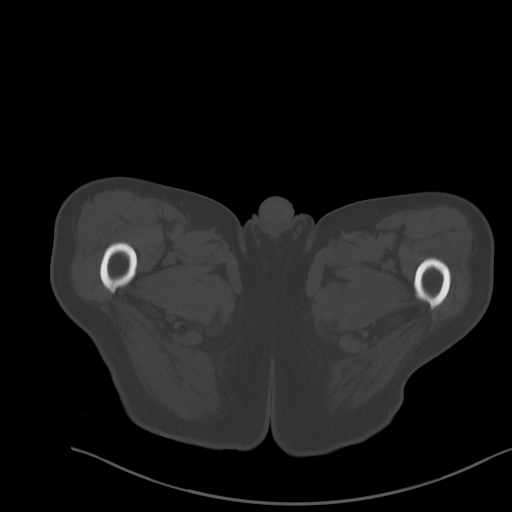
[im 13/98  soft-tissue]
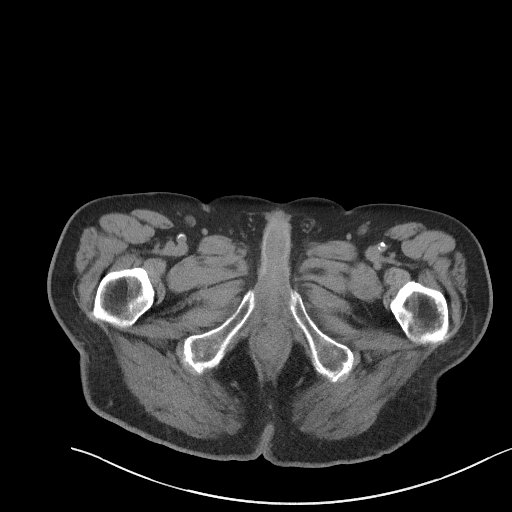
[im 21/98  soft-tissue]
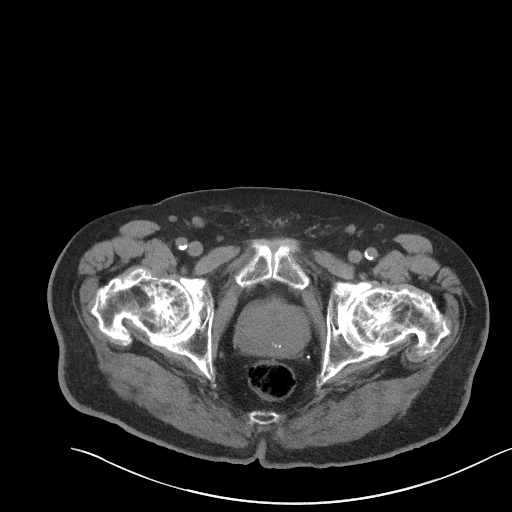
[im 29/98  soft-tissue]
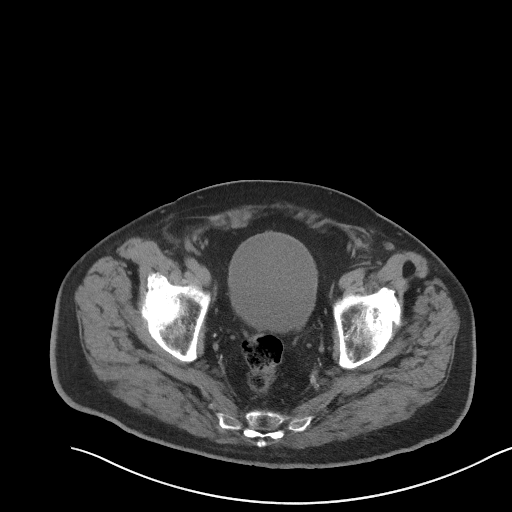
[im 33/98  soft-tissue]
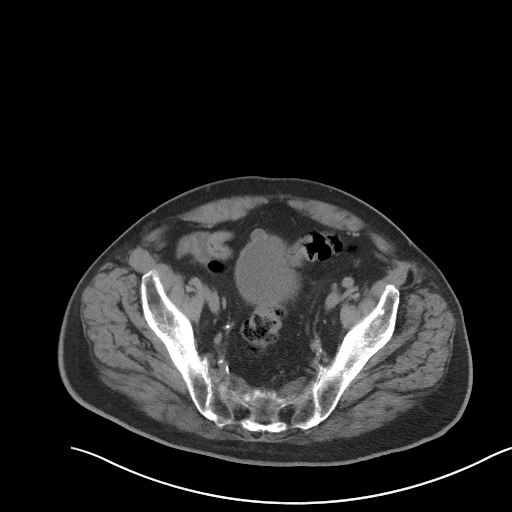
[im 41/98  soft-tissue]
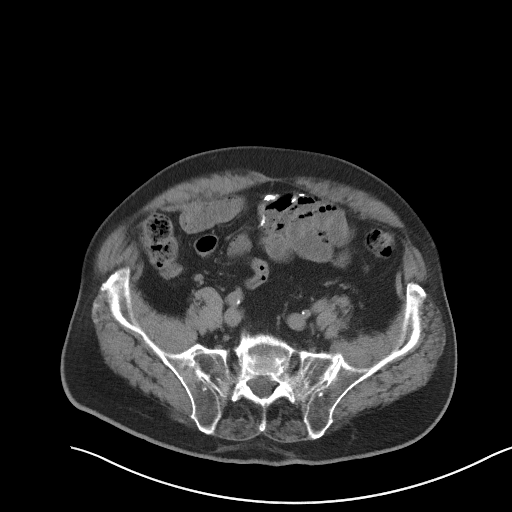
[im 49/98  soft-tissue]
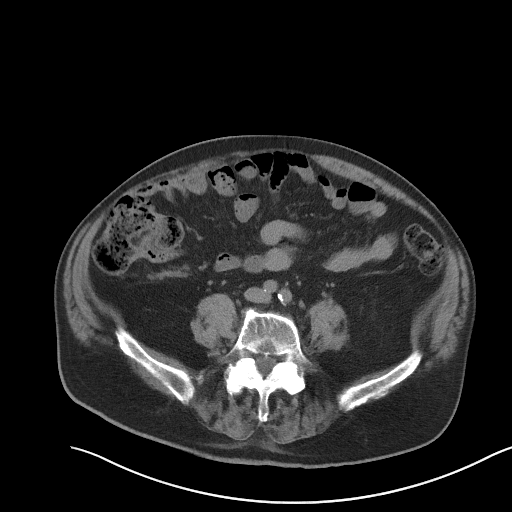
[im 57/98  soft-tissue]
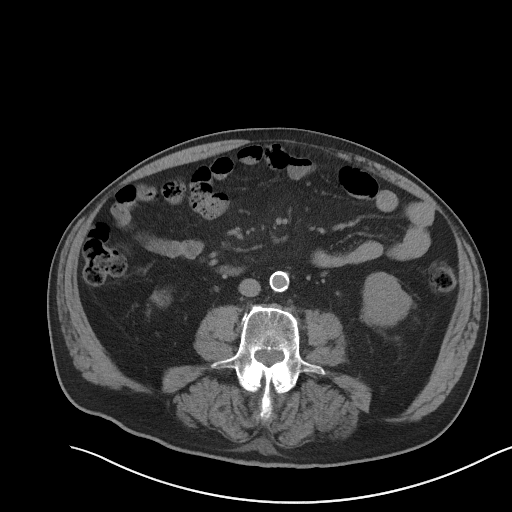
[im 65/98  soft-tissue]
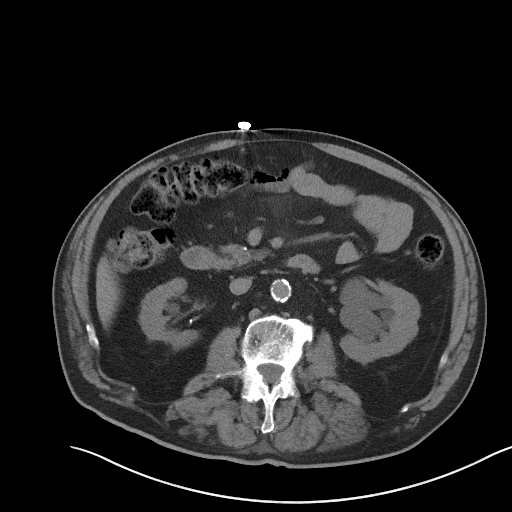
[im 65/98  bone]
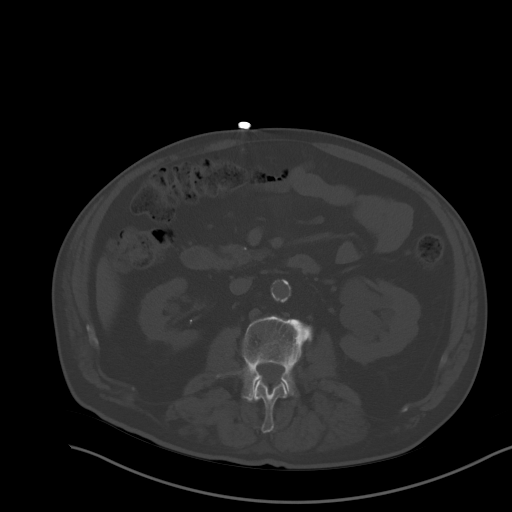
[im 69/98  soft-tissue]
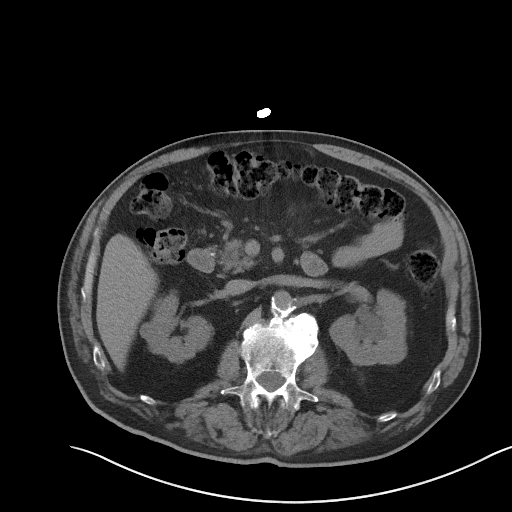
[im 77/98  soft-tissue]
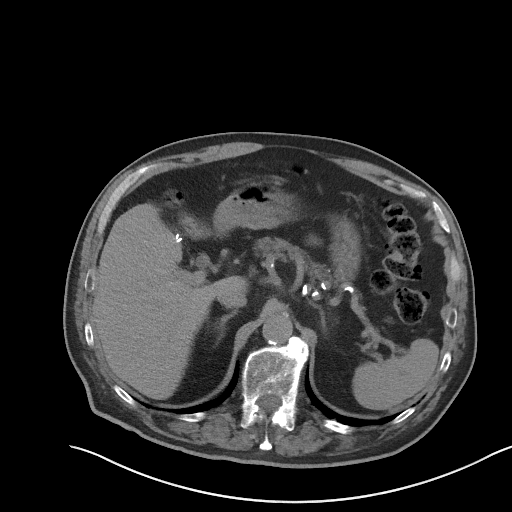
[im 85/98  soft-tissue]
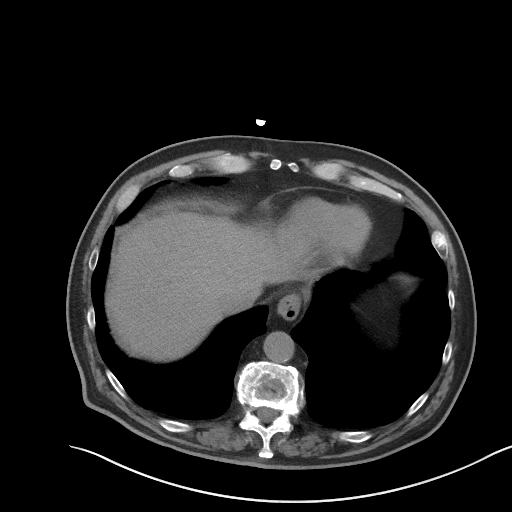
[im 93/98  soft-tissue]
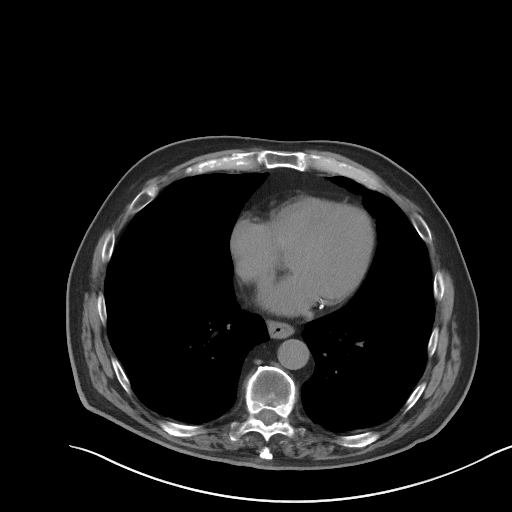

[Series 5: coronal · coronal · 0.79mm/px · 3 of 148 slices shown]
[im 50/148  soft-tissue]
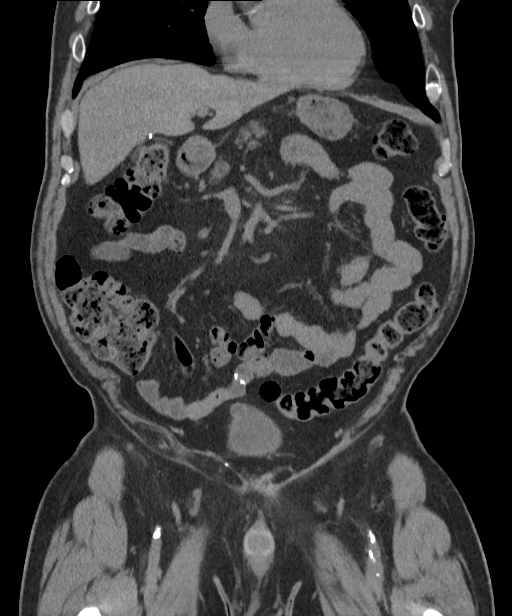
[im 66/148  soft-tissue]
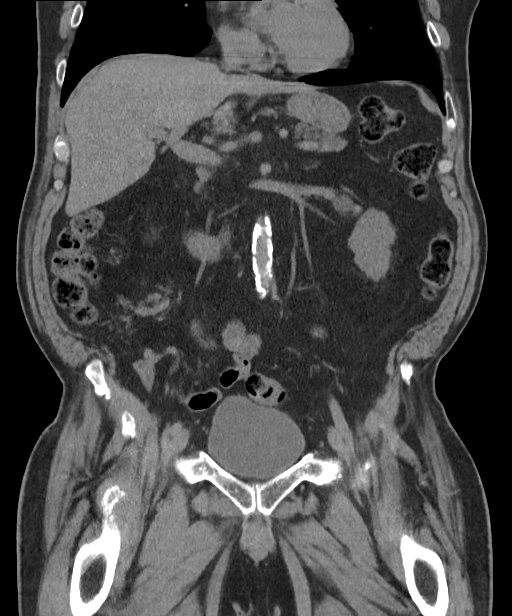
[im 82/148  soft-tissue]
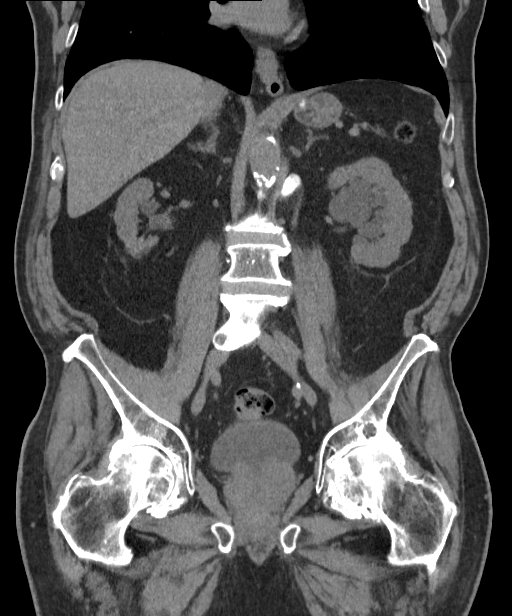

[16 of 46 positions shown; findings below may reference images not displayed]

FINDINGS: Lower chest: Clear lung bases. Normal heart size without pericardial
or pleural effusion. Multivessel coronary artery atherosclerosis.
Tiny hiatal hernia.

Hepatobiliary: Normal liver. Cholecystectomy, without biliary ductal
dilatation.

Pancreas: Parenchymal calcifications within the head and neck. No
duct dilatation or acute inflammation.

Spleen: Normal in size, without focal abnormality.

Adrenals/Urinary Tract: Normal adrenal glands. Subcentimeter upper
pole right renal lesion is fluid density and likely a cyst . No f/up
imaging recommended. Right renal vascular calcification. Punctate
lower pole left renal collecting system stone. No hydronephrosis. No
hydroureter or ureteric calculi. No bladder calculi. Tiny
right-sided bladder diverticulum on 66/2.

Stomach/Bowel: Proximal gastric underdistention. Colonic stool
burden suggests constipation. Normal terminal ileum and appendix.
Prior enterotomy.

Vascular/Lymphatic: Aortic atherosclerosis. Small nodes in the
jejunal mesentery with increased density in the mesenteric fat. No
pelvic sidewall adenopathy.

Reproductive: Moderate prostatomegaly.

Other: No significant free fluid. No free intraperitoneal air. Small
fat containing right inguinal hernia.

Musculoskeletal: Degenerate changes of both hips. Flowing
syndesmophytes throughout the thoracolumbar spine can be seen with
ankylosing spondylitis.
IMPRESSION: 1. Left nephrolithiasis, without obstructive uropathy.
2. Prostatomegaly with small bladder diverticulum, suggesting a
component of outlet obstruction.
3. No other explanation for patient's symptoms.
4. Coronary artery atherosclerosis. Aortic Atherosclerosis
([id number]).
5.  Possible constipation.
6. Small bowel mesenteric findings which suggest
panniculitis/adenitis. These are of indeterminate acuity and
clinical significance.
7. Pancreatic parenchymal calcifications suggest chronic calcific
pancreatitis.

## 2024-08-13 ENCOUNTER — Ambulatory Visit: Admitting: Urology
